# Patient Record
Sex: Female | Born: 1955 | Race: White | Hispanic: No | Marital: Married | Smoking: Never smoker
Health system: Southern US, Community
[De-identification: ages and names within clinical notes are randomized; demographics above are authoritative.]

## PROBLEM LIST (undated history)

## (undated) DIAGNOSIS — M1990 Unspecified osteoarthritis, unspecified site: Secondary | ICD-10-CM

## (undated) DIAGNOSIS — E7800 Pure hypercholesterolemia, unspecified: Secondary | ICD-10-CM

---

## 1998-06-28 ENCOUNTER — Other Ambulatory Visit: Admission: RE | Admit: 1998-06-28 | Discharge: 1998-06-28 | Payer: Self-pay | Admitting: Gynecology

## 1999-09-28 ENCOUNTER — Other Ambulatory Visit: Admission: RE | Admit: 1999-09-28 | Discharge: 1999-09-28 | Payer: Self-pay | Admitting: Gynecology

## 2000-10-18 ENCOUNTER — Encounter: Admission: RE | Admit: 2000-10-18 | Discharge: 2000-10-18 | Payer: Self-pay | Admitting: *Deleted

## 2000-10-18 ENCOUNTER — Encounter (INDEPENDENT_AMBULATORY_CARE_PROVIDER_SITE_OTHER): Payer: Self-pay | Admitting: Specialist

## 2000-10-18 ENCOUNTER — Other Ambulatory Visit: Admission: RE | Admit: 2000-10-18 | Discharge: 2000-10-18 | Payer: Self-pay | Admitting: *Deleted

## 2000-12-13 ENCOUNTER — Other Ambulatory Visit: Admission: RE | Admit: 2000-12-13 | Discharge: 2000-12-13 | Payer: Self-pay | Admitting: Gynecology

## 2003-02-15 ENCOUNTER — Other Ambulatory Visit: Admission: RE | Admit: 2003-02-15 | Discharge: 2003-02-15 | Payer: Self-pay | Admitting: Gynecology

## 2004-03-27 ENCOUNTER — Other Ambulatory Visit: Admission: RE | Admit: 2004-03-27 | Discharge: 2004-03-27 | Payer: Self-pay | Admitting: Gynecology

## 2005-03-19 ENCOUNTER — Other Ambulatory Visit: Admission: RE | Admit: 2005-03-19 | Discharge: 2005-03-19 | Payer: Self-pay | Admitting: Gynecology

## 2007-05-26 ENCOUNTER — Other Ambulatory Visit: Admission: RE | Admit: 2007-05-26 | Discharge: 2007-05-26 | Payer: Self-pay | Admitting: Gynecology

## 2014-10-17 ENCOUNTER — Emergency Department (HOSPITAL_BASED_OUTPATIENT_CLINIC_OR_DEPARTMENT_OTHER)
Admission: EM | Admit: 2014-10-17 | Discharge: 2014-10-17 | Disposition: A | Discharge status: Home / Self Care | Payer: Managed Care, Other (non HMO) | Attending: Emergency Medicine | Admitting: Emergency Medicine

## 2014-10-17 ENCOUNTER — Encounter (HOSPITAL_BASED_OUTPATIENT_CLINIC_OR_DEPARTMENT_OTHER): Payer: Self-pay | Admitting: *Deleted

## 2014-10-17 DIAGNOSIS — Y998 Other external cause status: Secondary | ICD-10-CM | POA: Insufficient documentation

## 2014-10-17 DIAGNOSIS — E780 Pure hypercholesterolemia: Secondary | ICD-10-CM | POA: Diagnosis not present

## 2014-10-17 DIAGNOSIS — S0181XA Laceration without foreign body of other part of head, initial encounter: Secondary | ICD-10-CM

## 2014-10-17 DIAGNOSIS — Z23 Encounter for immunization: Secondary | ICD-10-CM | POA: Diagnosis not present

## 2014-10-17 DIAGNOSIS — Y9389 Activity, other specified: Secondary | ICD-10-CM | POA: Diagnosis not present

## 2014-10-17 DIAGNOSIS — S0121XA Laceration without foreign body of nose, initial encounter: Secondary | ICD-10-CM | POA: Insufficient documentation

## 2014-10-17 DIAGNOSIS — Z8739 Personal history of other diseases of the musculoskeletal system and connective tissue: Secondary | ICD-10-CM | POA: Diagnosis not present

## 2014-10-17 DIAGNOSIS — W01198A Fall on same level from slipping, tripping and stumbling with subsequent striking against other object, initial encounter: Secondary | ICD-10-CM | POA: Insufficient documentation

## 2014-10-17 DIAGNOSIS — Z79899 Other long term (current) drug therapy: Secondary | ICD-10-CM | POA: Diagnosis not present

## 2014-10-17 DIAGNOSIS — Y9289 Other specified places as the place of occurrence of the external cause: Secondary | ICD-10-CM | POA: Insufficient documentation

## 2014-10-17 HISTORY — DX: Pure hypercholesterolemia, unspecified: E78.00

## 2014-10-17 HISTORY — DX: Unspecified osteoarthritis, unspecified site: M19.90

## 2014-10-17 MED ORDER — TETANUS-DIPHTH-ACELL PERTUSSIS 5-2.5-18.5 LF-MCG/0.5 IM SUSP
0.5000 mL | Freq: Once | INTRAMUSCULAR | Status: AC
  Administered 2014-10-17: 0.5 mL via INTRAMUSCULAR
  Filled 2014-10-17: qty 0.5

## 2014-10-17 NOTE — ED Provider Notes (Signed)
CSN: [id number]     Arrival date & time 10/17/14  1722 History   First MD Initiated Contact with Patient 10/17/14 1732     Chief Complaint  Patient presents with  . Facial Laceration     (Consider location/radiation/quality/duration/timing/severity/associated sxs/prior Treatment) Patient is a 58 y.o. female presenting with skin laceration. The history is provided by the patient.  Laceration Location:  Face Facial laceration location:  Nose  Lynn Cummings is a 58 y.o. female who presents to the ED with a laceration to the nasal bridge. She states that last night she tripped over her dog and fell against the refrigerator. She was wearing her glasses and when she hit her nose the metal on the glasses caused the laceration. She denies any other injuries. She denies LOC.   Past Medical History  Diagnosis Date  . Arthritis   . High cholesterol    History reviewed. No pertinent past surgical history. No family history on file. History  Substance Use Topics  . Smoking status: Never Smoker   . Smokeless tobacco: Not on file  . Alcohol Use: Yes     Comment: ocassiona   OB History    No data available     Review of Systems Negative except as stated in HPI   Allergies  Review of patient's allergies indicates no known allergies.  Home Medications   Prior to Admission medications   Medication Sig Start Date End Date Taking? Authorizing Provider  alendronate (FOSAMAX) 35 MG tablet Take 35 mg by mouth every 7 (seven) days. Take with a full glass of water on an empty stomach.   Yes Historical Provider, MD  simvastatin (ZOCOR) 10 MG tablet Take 10 mg by mouth daily.   Yes Historical Provider, MD   BP 154/86 mmHg  Pulse 94  Temp(Src) 98.3 F (36.8 C) (Oral)  Resp 16  Ht 5\' 4"  (1.626 m)  Wt 145 lb (65.772 kg)  BMI 24.88 kg/m2  SpO2 100% Physical Exam  Constitutional: She is oriented to person, place, and time. She appears well-developed and well-nourished.  HENT:  Right  Ear: Tympanic membrane normal.  Left Ear: Tympanic membrane normal.  Nose: Nose lacerations present.    Mouth/Throat: Uvula is midline, oropharynx is clear and moist and mucous membranes are normal. Normal dentition.  Eyes: Conjunctivae and EOM are normal. Pupils are equal, round, and reactive to light.  Neck: Normal range of motion. Neck supple.  Cardiovascular: Normal rate.   Pulmonary/Chest: Effort normal.  Musculoskeletal: Normal range of motion.  Neurological: She is alert and oriented to person, place, and time. No cranial nerve deficit.  Skin: Skin is warm and dry.  Psychiatric: She has a normal mood and affect. Her behavior is normal.  Nursing note and vitals reviewed.   ED Course  Procedures  LACERATION REPAIR Performed by: Cummings,Lynn Authorized by: Cummings,Lynn Consent: Verbal consent obtained. Risks and benefits: risks, benefits and alternatives were discussed Consent given by: patient Patient identity confirmed: provided demographic data Prepped and Draped in normal sterile fashion Wound explored  Laceration Location: nasal bridge  Laceration Length: 1.5 cm  No Foreign Bodies seen or palpated  Anesthesia: none  Cleaned with betadine and NSS  IAmount of cleaning: standard  Skin closure: dermabond  Patient tolerance: Patient tolerated the procedure well with no immediate complications.  Tetanus updated  MDM  58 y.o. female with laceration to the nasal bridge that occurred last night. Stable for discharge without signs of infection. Discussed with the patient and all  questioned fully answered. She will return if any problems arise.     [address]Hope MeyersdaleM Neese, NP 10/17/14 1753  Gilda Creasehristopher J. Pollina, MD 10/17/14 [phone]

## 2014-10-17 NOTE — ED Notes (Signed)
Patient tripped over her dog last night and now has a lac to the bridge of her nose

## 2016-05-22 ENCOUNTER — Emergency Department (HOSPITAL_BASED_OUTPATIENT_CLINIC_OR_DEPARTMENT_OTHER)
Admission: EM | Admit: 2016-05-22 | Discharge: 2016-05-22 | Disposition: A | Discharge status: Home / Self Care | Payer: BLUE CROSS/BLUE SHIELD | Attending: Emergency Medicine | Admitting: Emergency Medicine

## 2016-05-22 ENCOUNTER — Emergency Department (HOSPITAL_BASED_OUTPATIENT_CLINIC_OR_DEPARTMENT_OTHER): Payer: BLUE CROSS/BLUE SHIELD

## 2016-05-22 ENCOUNTER — Encounter (HOSPITAL_BASED_OUTPATIENT_CLINIC_OR_DEPARTMENT_OTHER): Payer: Self-pay

## 2016-05-22 DIAGNOSIS — S0121XA Laceration without foreign body of nose, initial encounter: Secondary | ICD-10-CM | POA: Diagnosis not present

## 2016-05-22 DIAGNOSIS — S025XXA Fracture of tooth (traumatic), initial encounter for closed fracture: Secondary | ICD-10-CM | POA: Insufficient documentation

## 2016-05-22 DIAGNOSIS — S0993XA Unspecified injury of face, initial encounter: Secondary | ICD-10-CM | POA: Diagnosis present

## 2016-05-22 DIAGNOSIS — Y939 Activity, unspecified: Secondary | ICD-10-CM | POA: Insufficient documentation

## 2016-05-22 DIAGNOSIS — W01198A Fall on same level from slipping, tripping and stumbling with subsequent striking against other object, initial encounter: Secondary | ICD-10-CM | POA: Insufficient documentation

## 2016-05-22 DIAGNOSIS — Y999 Unspecified external cause status: Secondary | ICD-10-CM | POA: Diagnosis not present

## 2016-05-22 DIAGNOSIS — M1990 Unspecified osteoarthritis, unspecified site: Secondary | ICD-10-CM | POA: Insufficient documentation

## 2016-05-22 DIAGNOSIS — W19XXXA Unspecified fall, initial encounter: Secondary | ICD-10-CM

## 2016-05-22 DIAGNOSIS — Y92009 Unspecified place in unspecified non-institutional (private) residence as the place of occurrence of the external cause: Secondary | ICD-10-CM | POA: Insufficient documentation

## 2016-05-22 DIAGNOSIS — S0083XA Contusion of other part of head, initial encounter: Secondary | ICD-10-CM

## 2016-05-22 DIAGNOSIS — S0081XA Abrasion of other part of head, initial encounter: Secondary | ICD-10-CM

## 2016-05-22 MED ORDER — LIDOCAINE HCL 1 % IJ SOLN
INTRAMUSCULAR | Status: AC
  Administered 2016-05-22: 20 mL
  Filled 2016-05-22: qty 20

## 2016-05-22 MED ORDER — LIDOCAINE-EPINEPHRINE 2 %-1:100000 IJ SOLN
20.0000 mL | Freq: Once | INTRAMUSCULAR | Status: DC
  Filled 2016-05-22: qty 1

## 2016-05-22 MED ORDER — TETANUS-DIPHTH-ACELL PERTUSSIS 5-2.5-18.5 LF-MCG/0.5 IM SUSP
0.5000 mL | Freq: Once | INTRAMUSCULAR | Status: AC
  Administered 2016-05-22: 0.5 mL via INTRAMUSCULAR
  Filled 2016-05-22: qty 0.5

## 2016-05-22 NOTE — ED Notes (Signed)
Pt states she slipped and fell in the kitchen and hit her face on the floor.  She denies LOC, is not very specific as to how she injured herself.  Pt has abrasion to nose with bruising, abrasion to chin and chipped front tooth.  Pt is slurring words and admits to drinking alcohol tonight.

## 2016-05-22 NOTE — ED Notes (Signed)
Patient transported to CT 

## 2016-05-22 NOTE — ED Notes (Signed)
Pt verbalizes understanding of d/c instructions and denies any further needs at this time. 

## 2016-05-22 NOTE — ED Notes (Signed)
Dr. Read DriversMolpus at bedside suturing

## 2016-05-22 NOTE — ED Notes (Signed)
Pt returned from CT °

## 2016-05-22 NOTE — ED Provider Notes (Addendum)
CSN: [id number]     Arrival date & time 05/22/16  0121 History   First MD Initiated Contact with Patient 05/22/16 0135     Chief Complaint  Patient presents with  . Facial Injury     (Consider location/radiation/quality/duration/timing/severity/associated sxs/prior Treatment) HPI  This is a 60 year old female who had a mechanical fall this morning just prior to arrival. She slipped in the kitchen and fell hitting her face. There was no loss of consciousness. She does admit to drinking alcohol earlier. She has a laceration and abrasion to her nose and an abrasion to her chin with  chipped upper central incisors. She denies neck pain or other injury. Bleeding is been controlled.  Past Medical History  Diagnosis Date  . Arthritis   . High cholesterol    History reviewed. No pertinent past surgical history. No family history on file. Social History  Substance Use Topics  . Smoking status: Never Smoker   . Smokeless tobacco: None  . Alcohol Use: Yes     Comment: ocassiona   OB History    No data available     Review of Systems  All other systems reviewed and are negative.   Allergies  Review of patient's allergies indicates no known allergies.  Home Medications   Prior to Admission medications   Medication Sig Start Date End Date Taking? Authorizing Provider  alendronate (FOSAMAX) 35 MG tablet Take 35 mg by mouth every 7 (seven) days. Take with a full glass of water on an empty stomach.    Historical Provider, MD  simvastatin (ZOCOR) 10 MG tablet Take 10 mg by mouth daily.    Historical Provider, MD   BP 97/79 mmHg  Pulse 63  Temp(Src) 97.6 F (36.4 C) (Oral)  Resp 18  Ht 5\' 4"  (1.626 m)  Wt 135 lb (61.236 kg)  BMI 23.16 kg/m2  SpO2 96%   Physical Exam  General: Well-developed, well-nourished female in no acute distress; appearance consistent with age of record HENT: normocephalic; abrasions and flap laceration to nose, dried blood in nares, abrasion to upper lip  and chin, swelling to left cheek, Ellis I fracture upper central incisors:    Eyes: pupils equal, round and reactive to light; extraocular muscles intact Neck: supple; no C-spine tenderness Heart: regular rate and rhythm Lungs: clear to auscultation bilaterally Abdomen: soft; nondistended; nontender; bowel sounds present Extremities: No deformity; full range of motion; pulses normal Neurologic: Awake, alert and oriented; motor function intact in all extremities and symmetric; no facial droop Skin: Warm and dry Psychiatric: Normal mood and affect    ED Course  Procedures (including critical care time)  LACERATION REPAIR Performed by: Emaya Preston L Authorized by: Hanley SeamenMOLPUS,Arzu Mcgaughey L Consent: Verbal consent obtained. Risks and benefits: risks, benefits and alternatives were discussed Consent given by: patient Patient identity confirmed: provided demographic data Prepped and Draped in normal sterile fashion Wound explored  Laceration Location: Left nose  Laceration Length: 2.5 cm (flap)  No Foreign Bodies seen or palpated  Anesthesia: local infiltration  Local anesthetic: lidocaine 2 % without epinephrine  Anesthetic total: One ml  Irrigation method: syringe Amount of cleaning: standard  Skin closure: 5-0 Prolene   Number of sutures: 4   Technique: Simple interrupted   Patient tolerance: Patient tolerated the procedure well with no immediate complications.    Patient was advised that the flap may be devitalized and thus may not "take". She was advised to have the sutures removed in 5 days.  MDM  Nursing notes and  vitals signs, including pulse oximetry, reviewed.  Summary of this visit's results, reviewed by myself:  Imaging Studies: Ct Maxillofacial Wo Cm  06/19/2016  CLINICAL DATA:  Slipped and fell in kitchen. Hit face on floor. Abrasion at the nose and chin. Chipped front tooth. Initial encounter. EXAM: CT MAXILLOFACIAL WITHOUT CONTRAST TECHNIQUE: Multidetector  CT imaging of the maxillofacial structures was performed. Multiplanar CT image reconstructions were also generated. A small metallic BB was placed on the right temple in order to reliably differentiate right from left. COMPARISON:  None. FINDINGS: There is no evidence of fracture or dislocation. The maxilla and mandible appear intact. The nasal bone is unremarkable in appearance. The visualized dentition demonstrates no acute abnormality. The known dental abnormality is not well seen due to metal artifact. The orbits are intact bilaterally. Minimal mucosal thickening is noted at the left maxillary sinus. The remaining visualized paranasal sinuses and mastoid air cells are well-aerated. Mild soft tissue swelling is noted at the chin. The parapharyngeal fat planes are preserved. The nasopharynx, oropharynx and hypopharynx are unremarkable in appearance. The visualized portions of the valleculae and piriform sinuses are grossly unremarkable. The parotid and submandibular glands are within normal limits. No cervical lymphadenopathy is seen. Mild cortical volume loss is noted. Mild cerebellar atrophy is seen. IMPRESSION: 1. No evidence of fracture of the maxillofacial structures. 2. Mild soft tissue swelling at the chin. 3. Minimal mucosal thickening at the left maxillary sinus. 4. Mild cortical volume loss noted Electronically Signed   By: Roanna Raider M.D.   On: June 19, 2016 03:12   The patient has a dentist with whom she will follow up regarding her fractured incisors.  Fall at home, initial encounter  Facial contusion, initial encounter  Laceration of nose with complication, initial encounter  Abrasion of face, initial encounter  Traumatic fracture of tooth, closed, initial encounter    Paula Libra, MD 19-Jun-2016 1610  Paula Libra, MD 2016/06/19 [id number]

## 2020-07-23 ENCOUNTER — Emergency Department (HOSPITAL_BASED_OUTPATIENT_CLINIC_OR_DEPARTMENT_OTHER)
Admission: EM | Admit: 2020-07-23 | Discharge: 2020-07-23 | Disposition: A | Discharge status: Home / Self Care | Payer: 59 | Attending: Emergency Medicine | Admitting: Emergency Medicine

## 2020-07-23 ENCOUNTER — Encounter (HOSPITAL_BASED_OUTPATIENT_CLINIC_OR_DEPARTMENT_OTHER): Payer: Self-pay | Admitting: Emergency Medicine

## 2020-07-23 ENCOUNTER — Other Ambulatory Visit: Payer: Self-pay

## 2020-07-23 ENCOUNTER — Emergency Department (HOSPITAL_BASED_OUTPATIENT_CLINIC_OR_DEPARTMENT_OTHER): Payer: 59

## 2020-07-23 DIAGNOSIS — S0083XA Contusion of other part of head, initial encounter: Secondary | ICD-10-CM | POA: Diagnosis not present

## 2020-07-23 DIAGNOSIS — Y9289 Other specified places as the place of occurrence of the external cause: Secondary | ICD-10-CM | POA: Diagnosis not present

## 2020-07-23 DIAGNOSIS — W19XXXA Unspecified fall, initial encounter: Secondary | ICD-10-CM

## 2020-07-23 DIAGNOSIS — W010XXA Fall on same level from slipping, tripping and stumbling without subsequent striking against object, initial encounter: Secondary | ICD-10-CM | POA: Diagnosis not present

## 2020-07-23 DIAGNOSIS — L03213 Periorbital cellulitis: Secondary | ICD-10-CM | POA: Diagnosis not present

## 2020-07-23 DIAGNOSIS — Y998 Other external cause status: Secondary | ICD-10-CM | POA: Insufficient documentation

## 2020-07-23 DIAGNOSIS — G501 Atypical facial pain: Secondary | ICD-10-CM | POA: Diagnosis present

## 2020-07-23 DIAGNOSIS — R220 Localized swelling, mass and lump, head: Secondary | ICD-10-CM | POA: Insufficient documentation

## 2020-07-23 DIAGNOSIS — Y9301 Activity, walking, marching and hiking: Secondary | ICD-10-CM | POA: Insufficient documentation

## 2020-07-23 MED ORDER — CLINDAMYCIN HCL 300 MG PO CAPS
300.0000 mg | ORAL_CAPSULE | Freq: Three times a day (TID) | ORAL | 0 refills | Status: AC
Start: 2020-07-23 — End: 2020-07-30

## 2020-07-23 MED ORDER — FLUORESCEIN SODIUM 1 MG OP STRP
1.0000 | ORAL_STRIP | Freq: Once | OPHTHALMIC | Status: AC
  Administered 2020-07-23: 1 via OPHTHALMIC
  Filled 2020-07-23: qty 1

## 2020-07-23 MED ORDER — TETRACAINE HCL 0.5 % OP SOLN
1.0000 [drp] | Freq: Once | OPHTHALMIC | Status: AC
  Administered 2020-07-23: 1 [drp] via OPHTHALMIC
  Filled 2020-07-23: qty 4

## 2020-07-23 NOTE — ED Provider Notes (Signed)
[facility] EMERGENCY DEPARTMENT Provider Note   CSN: [id number] Arrival date & time: 07/23/20  0941     History Chief Complaint  Patient presents with  . Fall    Lynn Cummings is a 64 y.o. female presents for evaluation of left-sided facial pain, swelling that began after mechanical fall approximately 3 days ago.  She reports that she was walking her dog and states that he pulled on the leash, causing her to fall and land on the cement.  She reports hitting her face.  No LOC.  She states since then, she has had pain, swelling noted around the left eye and left face.  She states that she was wearing contacts at the time.  She was able to take them out and states that she has not worn them since.  She states that she has not had any vision changes.  She states that the pain and swelling have increased, prompting ED visit today.  She is not on blood thinners.  She denies any neck pain, numbness/weakness of arms or legs.  The history is provided by the patient.       Past Medical History:  Diagnosis Date  . Arthritis   . High cholesterol     There are no problems to display for this patient.   History reviewed. No pertinent surgical history.   OB History   No obstetric history on file.     No family history on file.  Social History   Tobacco Use  . Smoking status: Never Smoker  . Smokeless tobacco: Never Used  Substance Use Topics  . Alcohol use: Yes    Comment: ocassiona  . Drug use: Not on file    Home Medications Prior to Admission medications   Medication Sig Start Date End Date Taking? Authorizing Provider  alendronate (FOSAMAX) 35 MG tablet Take 35 mg by mouth every 7 (seven) days. Take with a full glass of water on an empty stomach.    [provider]  clindamycin (CLEOCIN) 300 MG capsule Take 1 capsule (300 mg total) by mouth 3 (three) times daily for 7 days. 07/23/20 07/30/20  Maxwell Caul, PA-C  simvastatin (ZOCOR) 10 MG tablet  Take 10 mg by mouth daily.    [provider]    Allergies    Patient has no known allergies.  Review of Systems   Review of Systems  HENT: Positive for facial swelling.   Eyes: Negative for visual disturbance.  Gastrointestinal: Negative for vomiting.  Skin: Positive for wound.  Neurological: Negative for weakness and numbness.  All other systems reviewed and are negative.   Physical Exam Updated Vital Signs BP (!) 151/69 (BP Location: Right Arm)   Pulse 73   Temp 98.4 F (36.9 C) (Oral)   Resp 18   Ht 5\' 4"  (1.626 m)   Wt 59 kg   SpO2 99%   BMI 22.31 kg/m   Physical Exam Vitals and nursing note reviewed.  Constitutional:      Appearance: She is well-developed.  HENT:     Head: Normocephalic and atraumatic.      Comments: Tenderness palpation, swelling, erythema, ecchymosis noted around the superior inferior left periorbital region.  No tenderness palpation of the nasal bridge, zygomatic arch, mandible or maxilla. Eyes:     General: No scleral icterus.       Right eye: No discharge.        Left eye: No discharge.     Conjunctiva/sclera:  Left eye: Left conjunctiva is injected.     Comments: PERRL. EOMs intact without pain. No nystagmus. No neglect. Visual fields intact.  No hyphema, hypopyon.  Mild conjunctival injection noted to the lateral aspect.  Pulmonary:     Effort: Pulmonary effort is normal.  Skin:    General: Skin is warm and dry.     Comments: 3 x 2 cm abrasion with some purulent drainage noted to the left face.  Neurological:     Mental Status: She is alert.     Comments: Cranial nerves III-XII intact Follows commands, Moves all extremities  5/5 strength to BUE and BLE  Sensation intact throughout all major nerve distributions. No gait abnormalities  No slurred speech. No facial droop.   Psychiatric:        Speech: Speech normal.        Behavior: Behavior normal.          ED Results / Procedures / Treatments   Labs (all  labs ordered are listed, but only abnormal results are displayed) Labs Reviewed - No data to display  EKG None  Radiology CT Orbits Wo Contrast  Result Date: 07/23/2020 CLINICAL DATA:  Facial trauma status post fall. Left eye swelling and redness. EXAM: CT ORBITS WITHOUT CONTRAST TECHNIQUE: Multidetector CT images were obtained using the standard protocol without intravenous contrast. COMPARISON:  None. FINDINGS: Orbits: No traumatic or inflammatory finding. Globes, optic nerves, orbital fat, extraocular muscles, vascular structures, and lacrimal glands are normal. Visualized sinuses: Paranasal sinuses are clear. Mastoid sinuses are clear. Leftward deviation of the nasal septum. Soft tissues: Severe left periorbital soft tissue swelling. Mild soft tissue swelling over the left maxillary sinus. Limited intracranial: No significant or unexpected finding. IMPRESSION: 1. No acute osseous injury of the left orbit. 2. Severe left periorbital soft tissue swelling. Mild soft tissue swelling over the left maxillary sinus. Electronically Signed   By: Elige Ko   On: 07/23/2020 13:11    Procedures Procedures (including critical care time)  Medications Ordered in ED Medications  tetracaine (PONTOCAINE) 0.5 % ophthalmic solution 1 drop (1 drop Both Eyes Given by Other 07/23/20 1447)  fluorescein ophthalmic strip 1 strip (1 strip Both Eyes Given by Other 07/23/20 1448)    ED Course  I have reviewed the triage vital signs and the nursing notes.  Pertinent labs & imaging results that were available during my care of the patient were reviewed by me and considered in my medical decision making (see chart for details).    MDM Rules/Calculators/A&P                          64 year old female who presents for evaluation of facial pain, swelling after mechanical fall that occurred approximately 3 days ago.  No LOC.  She is not on blood thinner.  No blurry vision, vision changes.  Initially arrival, she is  afebrile, nontoxic-appearing.  Vital signs are stable.  She has swelling, tenderness noted around the left periorbital region.  EOMs intact without pain.  No hyphema, hypopyon.  Concern for fracture.  We will plan for imaging of the orbits.  CT scan negative for any acute bony abnormality.  No evidence of fractures.  Woods lamp evaluation shows no evidence of fluorescein uptake, corneal abrasion.  Intraocular pressure as documented below:  Left IOP: 16 Right IOP: 14    Visual Acuity  Right Eye Distance: 20/20 Left Eye Distance: 20/20 Bilateral Distance: 20/20  Right Eye Near:  R Near: 20/20 Left Eye Near:  L Near: 20/20 Bilateral Near:  20/20 (with corrective lenses)  Patient with reassuring visual acuity, IOP's.  She does have some purulent drainage noted to the abrasion.  Question if some of the erythema that she has around her is beginning infection.  She has no evidence of pain with EOMs.  Do not suspect orbital cellulitis.  We will plan to send her home on clindamycin. At this time, patient exhibits no emergent life-threatening condition that require further evaluation in ED or admission. Patient had ample opportunity for questions and discussion. All patient's questions were answered with full understanding. Strict return precautions discussed. Patient expresses understanding and agreement to plan.   Portions of this note were generated with Scientist, clinical (histocompatibility and immunogenetics). Dictation errors may occur despite best attempts at proofreading.   Final Clinical Impression(s) / ED Diagnoses Final diagnoses:  Fall, initial encounter  Contusion of face, initial encounter  Periorbital cellulitis of left eye    Rx / DC Orders ED Discharge Orders         Ordered    clindamycin (CLEOCIN) 300 MG capsule  3 times daily     Discontinue  Reprint     07/23/20 1502           Maxwell Caul, PA-C 07/23/20 1619    Arby Barrette, MD 08/10/20 1429

## 2020-07-23 NOTE — ED Triage Notes (Addendum)
She was pulled down by her dog on Thursday landing face first on cement. Denies LOC. Swelling noted around L eye and abrasions to face. No blood thinners

## 2020-07-23 NOTE — Discharge Instructions (Signed)
Your CT scan did not show any evidence of broken bones.  As we discussed, apply ice to help with the swelling.  Take antibiotics to prevent any infection.  Follow-up with your eye doctor.  Return to the emergency department for any fever, worsening pain, vision changes or any other worsening concerning symptoms.

## 2021-03-06 IMAGING — CT CT ORBITS W/O CM
3 series · 14 of 47 positions shown, 16 images · non-contrast
Comparison: None.

CLINICAL DATA: Facial trauma status post fall. Left eye swelling
and redness.

EXAM:
CT ORBITS WITHOUT CONTRAST
TECHNIQUE: Multidetector CT images were obtained using the standard protocol
without intravenous contrast.

[Series 3: orbits 2.0 h30s st · axial · 0.35mm/px · z∈[-150,-80]mm · 8 of 41 slices shown, 10 images]
[im 3/41  brain]
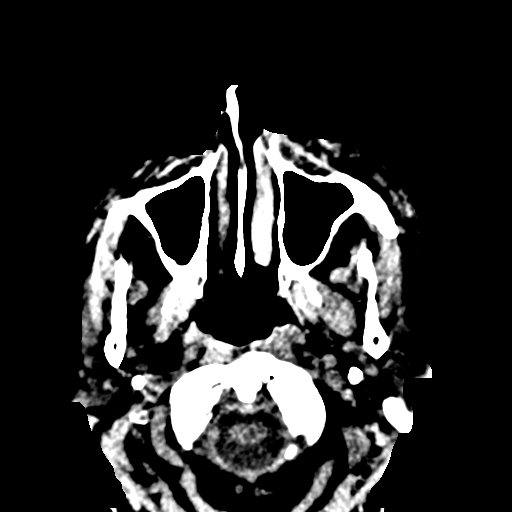
[im 3/41  bone]
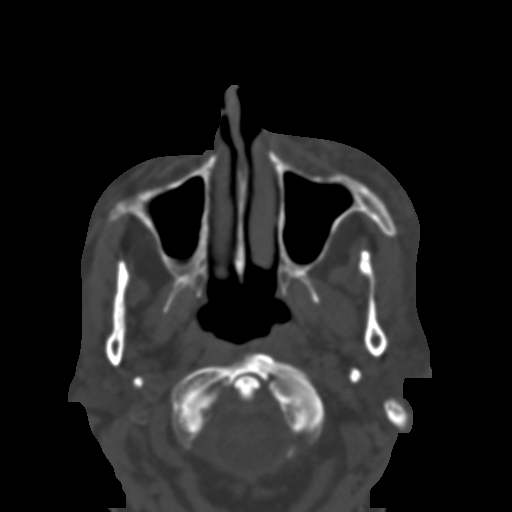
[im 9/41  bone]
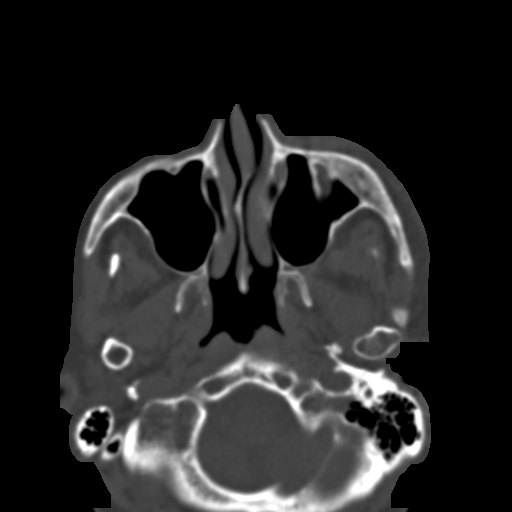
[im 13/41  bone]
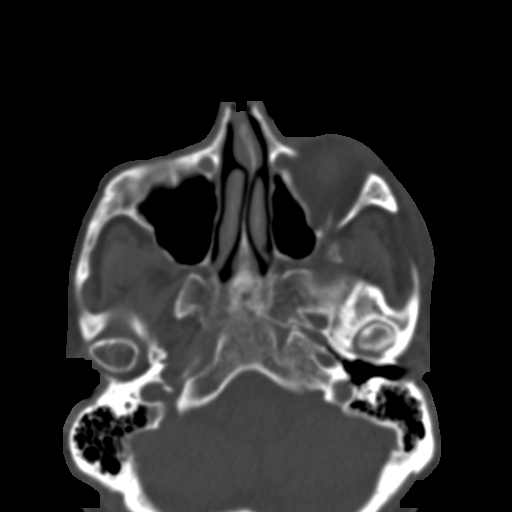
[im 18/41  bone]
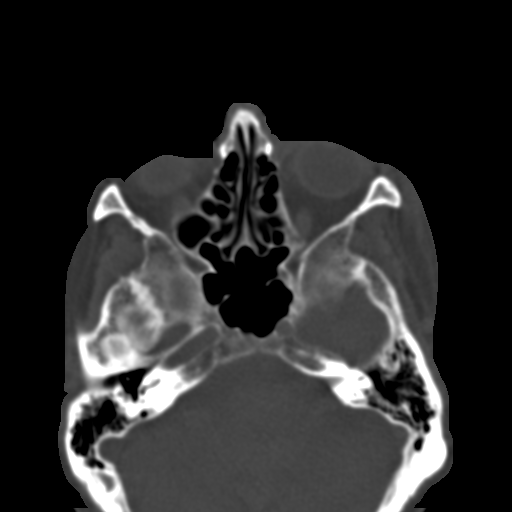
[im 23/41  brain]
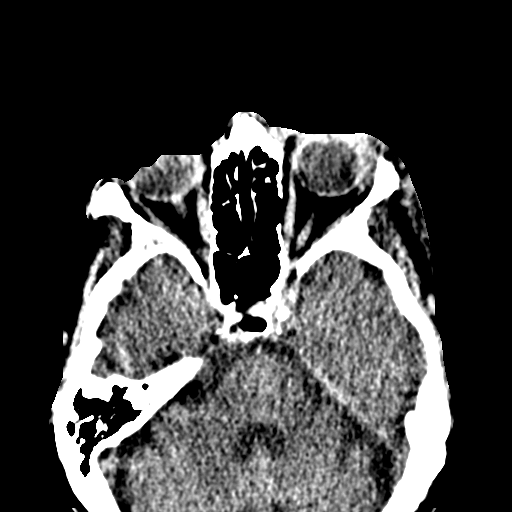
[im 23/41  bone]
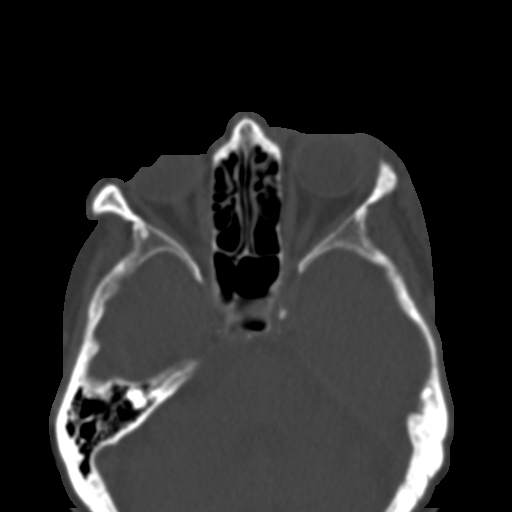
[im 28/41  bone]
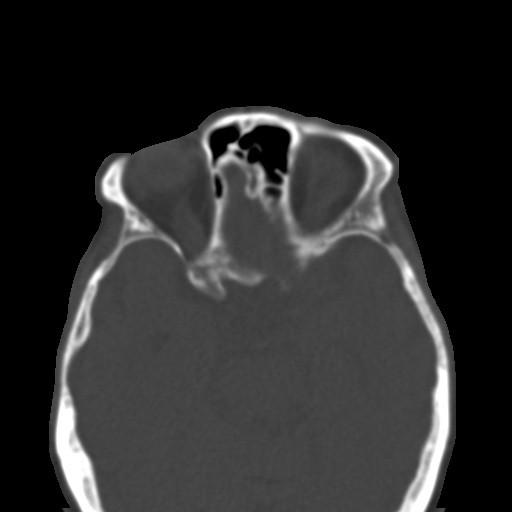
[im 32/41  bone]
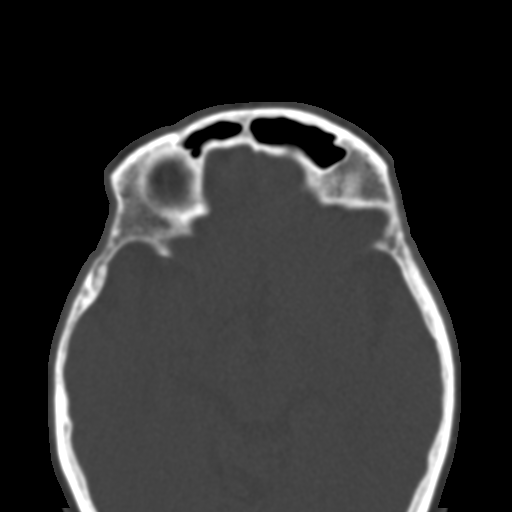
[im 38/41  bone]
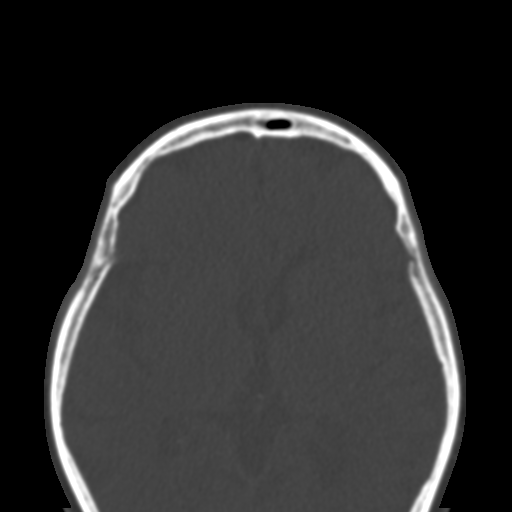

[Series 7: orbits 2.0 coronal · coronal · 0.18mm/px · 3 of 57 slices shown]
[im 19/57  bone]
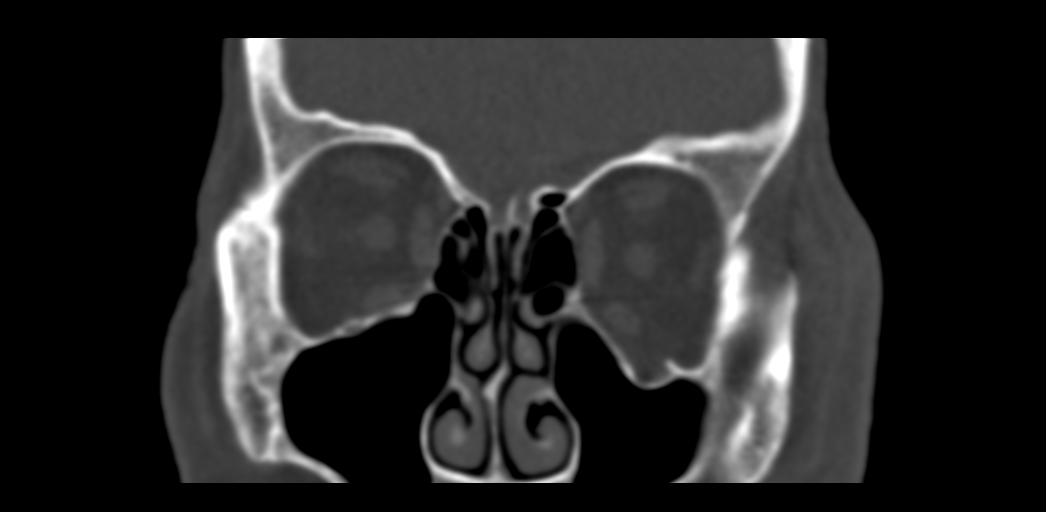
[im 25/57  bone]
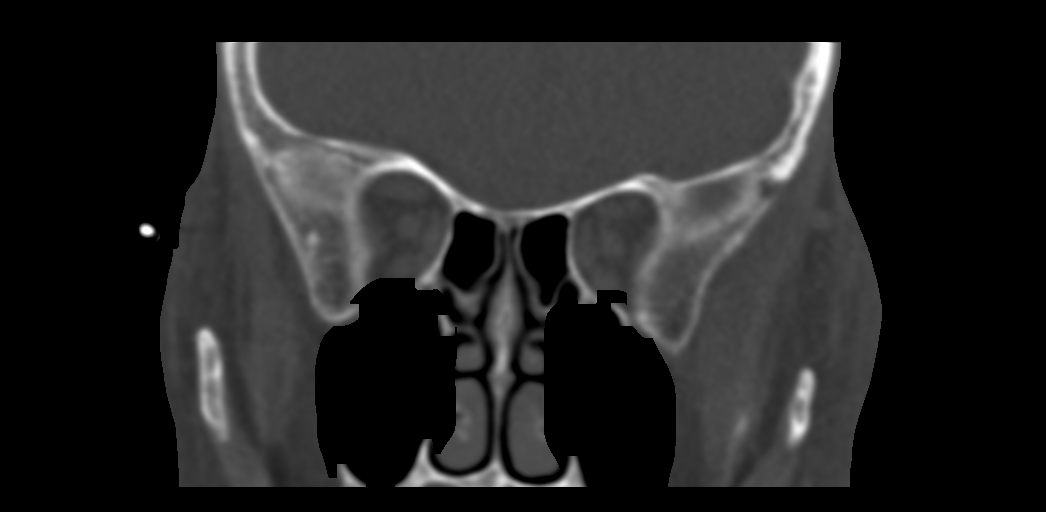
[im 32/57  bone]
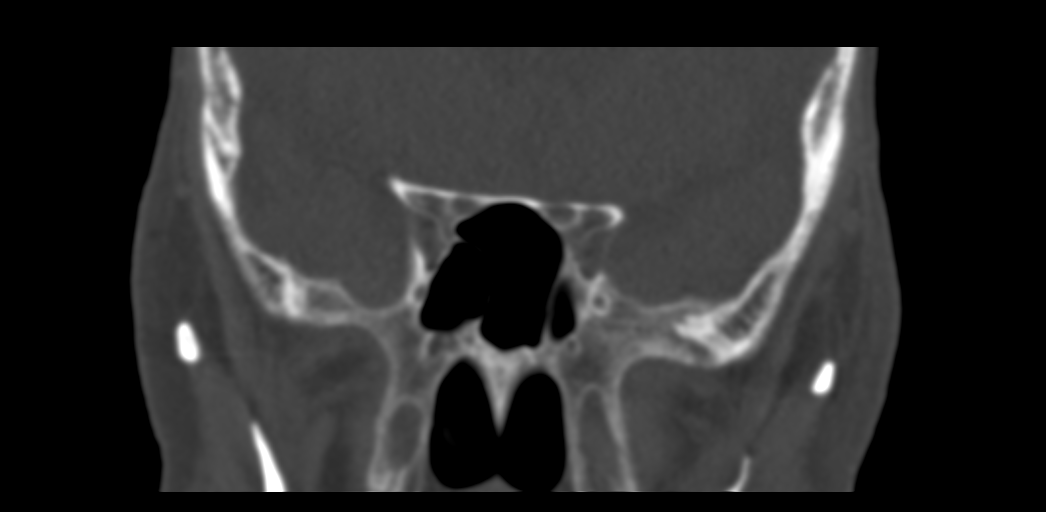

[Series 9: orbits 2.0 sagittal · sagittal · 0.18mm/px · 3 of 90 slices shown]
[im 30/90  bone]
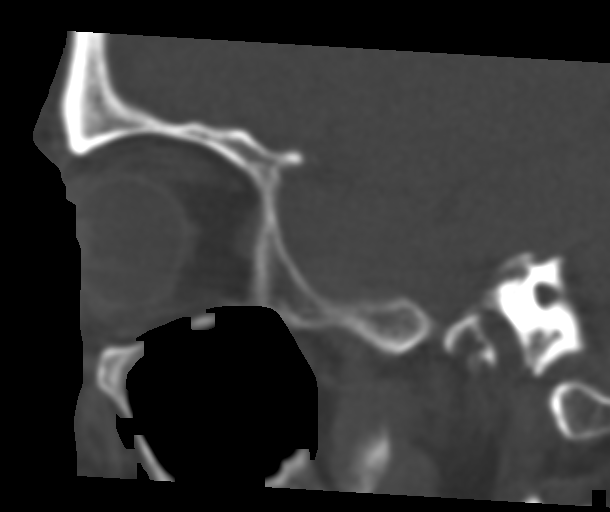
[im 45/90  bone]
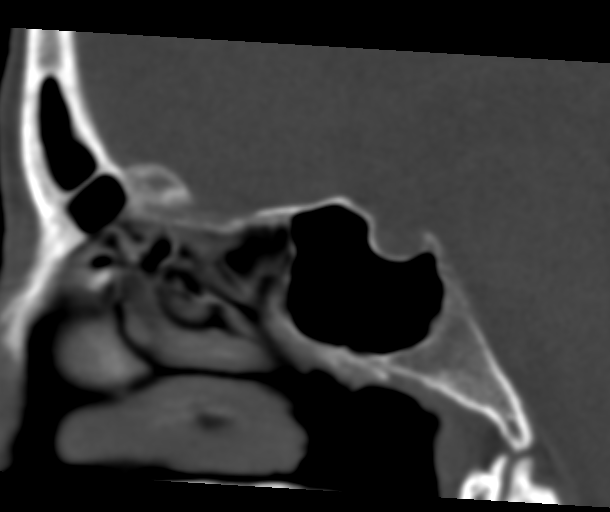
[im 60/90  bone]
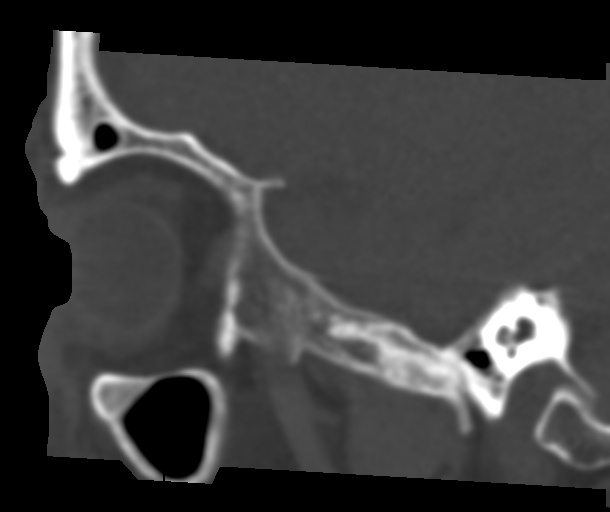

[14 of 47 positions shown; findings below may reference images not displayed]

FINDINGS: Orbits: No traumatic or inflammatory finding. Globes, optic nerves,
orbital fat, extraocular muscles, vascular structures, and lacrimal
glands are normal.

Visualized sinuses: Paranasal sinuses are clear. Mastoid sinuses are
clear. Leftward deviation of the nasal septum.

Soft tissues: Severe left periorbital soft tissue swelling. Mild
soft tissue swelling over the left maxillary sinus.

Limited intracranial: No significant or unexpected finding.
IMPRESSION: 1. No acute osseous injury of the left orbit.
2. Severe left periorbital soft tissue swelling. Mild soft tissue
swelling over the left maxillary sinus.

## 2023-07-25 ENCOUNTER — Other Ambulatory Visit: Payer: Self-pay

## 2023-07-25 ENCOUNTER — Emergency Department (HOSPITAL_BASED_OUTPATIENT_CLINIC_OR_DEPARTMENT_OTHER): Payer: Medicare Other

## 2023-07-25 ENCOUNTER — Encounter (HOSPITAL_BASED_OUTPATIENT_CLINIC_OR_DEPARTMENT_OTHER): Payer: Self-pay | Admitting: Emergency Medicine

## 2023-07-25 ENCOUNTER — Emergency Department (HOSPITAL_BASED_OUTPATIENT_CLINIC_OR_DEPARTMENT_OTHER)
Admission: EM | Admit: 2023-07-25 | Discharge: 2023-07-26 | Disposition: A | Discharge status: Home / Self Care | Payer: Medicare Other | Source: Home / Self Care | Attending: Emergency Medicine | Admitting: Emergency Medicine

## 2023-07-25 DIAGNOSIS — R42 Dizziness and giddiness: Secondary | ICD-10-CM

## 2023-07-25 DIAGNOSIS — R739 Hyperglycemia, unspecified: Secondary | ICD-10-CM

## 2023-07-25 DIAGNOSIS — R7309 Other abnormal glucose: Secondary | ICD-10-CM

## 2023-07-25 DIAGNOSIS — R7401 Elevation of levels of liver transaminase levels: Secondary | ICD-10-CM

## 2023-07-25 DIAGNOSIS — E876 Hypokalemia: Secondary | ICD-10-CM

## 2023-07-25 LAB — CBC WITH DIFFERENTIAL/PLATELET
Abs Immature Granulocytes: 0.01 10*3/uL (ref 0.00–0.07)
Basophils Absolute: 0 10*3/uL (ref 0.0–0.1)
Basophils Relative: 1 %
Eosinophils Absolute: 0 10*3/uL (ref 0.0–0.5)
Eosinophils Relative: 1 %
HCT: 38.9 % (ref 36.0–46.0)
Hemoglobin: 12.9 g/dL (ref 12.0–15.0)
Immature Granulocytes: 0 %
Lymphocytes Relative: 32 %
Lymphs Abs: 1.4 10*3/uL (ref 0.7–4.0)
MCH: 32.5 pg (ref 26.0–34.0)
MCHC: 33.2 g/dL (ref 30.0–36.0)
MCV: 98 fL (ref 80.0–100.0)
Monocytes Absolute: 0.4 10*3/uL (ref 0.1–1.0)
Monocytes Relative: 9 %
Neutro Abs: 2.6 10*3/uL (ref 1.7–7.7)
Neutrophils Relative %: 57 %
Platelets: 152 10*3/uL (ref 150–400)
RBC: 3.97 MIL/uL (ref 3.87–5.11)
RDW: 13.6 % (ref 11.5–15.5)
WBC: 4.4 10*3/uL (ref 4.0–10.5)
nRBC: 0 % (ref 0.0–0.2)

## 2023-07-25 LAB — COMPREHENSIVE METABOLIC PANEL
ALT: 33 U/L (ref 0–44)
AST: 65 U/L — ABNORMAL HIGH (ref 15–41)
Albumin: 4.2 g/dL (ref 3.5–5.0)
Alkaline Phosphatase: 107 U/L (ref 38–126)
Anion gap: 13 (ref 5–15)
BUN: 13 mg/dL (ref 8–23)
CO2: 27 mmol/L (ref 22–32)
Calcium: 9.4 mg/dL (ref 8.9–10.3)
Chloride: 102 mmol/L (ref 98–111)
Creatinine, Ser: 0.66 mg/dL (ref 0.44–1.00)
GFR, Estimated: >60 mL/min (ref >60)
Glucose, Bld: 153 mg/dL — ABNORMAL HIGH (ref 70–99)
Potassium: 3.3 mmol/L — ABNORMAL LOW (ref 3.5–5.1)
Sodium: 142 mmol/L (ref 135–145)
Total Bilirubin: 0.6 mg/dL (ref 0.3–1.2)
Total Protein: 6.7 g/dL (ref 6.5–8.1)

## 2023-07-25 LAB — CBG MONITORING, ED: Glucose-Capillary: 139 mg/dL — ABNORMAL HIGH (ref 70–99)

## 2023-07-25 NOTE — ED Provider Notes (Signed)
[facility] Emergency Department Provider Note MRN:  [id number]  Arrival date & time: 07/25/23     Chief Complaint   Dizziness   History of Present Illness   Lynn Cummings is a 67 y.o. year-old female with a history of hyperlipidemia presenting to the ED with chief complaint of dizziness.  Woke up with dizziness described as a room spinning sensation.  Fairly constant and lasting all day.  Does not seem changed or triggered by head position.  Having a lot of trouble ambulating due to the symptoms, needing a lot of assistance which is abnormal for her.  Review of Systems  A thorough review of systems was obtained and all systems are negative except as noted in the HPI and PMH.   Patient's Health History    Past Medical History:  Diagnosis Date   Arthritis    High cholesterol     History reviewed. No pertinent surgical history.  History reviewed. No pertinent family history.  Social History   Socioeconomic History   Marital status: Married    Spouse name: Not on file   Number of children: Not on file   Years of education: Not on file   Highest education level: Not on file  Occupational History   Not on file  Tobacco Use   Smoking status: Never   Smokeless tobacco: Never  Substance and Sexual Activity   Alcohol use: Yes    Comment: ocassiona   Drug use: Not on file   Sexual activity: Not on file  Other Topics Concern   Not on file  Social History Narrative   Not on file   Social Determinants of Health   Financial Resource Strain: Not on file  Food Insecurity: Not on file  Transportation Needs: Not on file  Physical Activity: Not on file  Stress: Not on file  Social Connections: Not on file  Intimate Partner Violence: Not on file     Physical Exam   Vitals:   07/25/23 1757  BP: (!) 152/86  Pulse: 94  Resp: 20  Temp: 98.2 F (36.8 C)  SpO2: 98%    CONSTITUTIONAL: Well-appearing, NAD NEURO/PSYCH:  Alert and  oriented x 3, normal and symmetric strength and sensation, possibly some subtle overshooting or hesitancy with finger-nose-finger testing on the right, mild action tremor to the left hand EYES:  eyes equal and reactive ENT/NECK:  no LAD, no JVD CARDIO: Regular rate, well-perfused, normal S1 and S2 PULM:  CTAB no wheezing or rhonchi GI/GU:  non-distended, non-tender MSK/SPINE:  No gross deformities, no edema SKIN:  no rash, atraumatic   *Additional and/or pertinent findings included in MDM below  Diagnostic and Interventional Summary    EKG Interpretation Date/Time:  Thursday July 25 2023 18:04:31 EDT Ventricular Rate:  86 PR Interval:    QRS Duration:  72 QT Interval:  340 QTC Calculation: 406 R Axis:   34  Text Interpretation: Interpretation limited secondary to artifact Normal sinus rhythm Nonspecific T wave abnormality Abnormal ECG No previous ECGs available Confirmed by Vanetta Mulders [phone]) on 07/25/2023 6:08:10 PM       Labs Reviewed  COMPREHENSIVE METABOLIC PANEL - Abnormal; Notable for the following components:      Result Value   Potassium 3.3 (*)    Glucose, Bld 153 (*)    AST 65 (*)    All other components within normal limits  CBG MONITORING, ED - Abnormal; Notable for the following components:   Glucose-Capillary 139 (*)  All other components within normal limits  CBC WITH DIFFERENTIAL/PLATELET  URINALYSIS, ROUTINE W REFLEX MICROSCOPIC    CT Head Wo Contrast  Final Result    MR BRAIN WO CONTRAST    (Results Pending)    Medications - No data to display   Procedures  /  Critical Care Procedures  ED Course and Medical Decision Making  Initial Impression and Ddx Persistent vertigo, nonpositional, fairly constant.  Overall a bit concerning for central cause given the description.  Given the fairly acute onset less than 24 hours ago, plan is for MRI imaging.  Past medical/surgical history that increases complexity of ED encounter: Hyperlipidemia,  otherwise minimal cardiovascular risk factors  Interpretation of Diagnostics I personally reviewed the EKG and my interpretation is as follows: Sinus rhythm  Labs overall reassuring with no significant blood count or electrolyte disturbance  Patient Reassessment and Ultimate Disposition/Management     Plan is for transfer to [facility] emergency department for MRI.  Dr. Preston Fleeting is the accepting ED provider.  Patient management required discussion with the following services or consulting groups:  None  Complexity of Problems Addressed Acute illness or injury that poses threat of life of bodily function  Additional Data Reviewed and Analyzed Further history obtained from: Further history from spouse/family member  Additional Factors Impacting ED Encounter Risk None  Elmer Sow. Pilar Plate, MD [facility] Emergency Medicine [facility] [email or web address]  Final Clinical Impressions(s) / ED Diagnoses     ICD-10-CM   1. Vertigo  R42       ED Discharge Orders     None        Discharge Instructions Discussed with and Provided to Patient:    Discharge Instructions      We are transferring you to the [facility] emergency department for MRI imaging tonight to evaluate for stroke.  You have elected to travel via private vehicle.      Sabas Sous, MD 07/25/23 [phone]

## 2023-07-25 NOTE — Discharge Instructions (Addendum)
Your MRI did not show any sign of a stroke.  This appears to be vertigo.  I have prescribed meclizine which she can take as often as 3 times a day as needed for vertigo.  For further evaluation, please follow-up with the neurologist.  Return if you have any new or concerning symptoms.

## 2023-07-25 NOTE — ED Triage Notes (Signed)
Pt reports dizziness (room spinning) since she woke this morning at 0830; denies numbness or weakness; no hx of vertigo; denies pain

## 2023-07-26 ENCOUNTER — Emergency Department (HOSPITAL_COMMUNITY): Payer: Medicare Other

## 2023-07-26 MED ORDER — MECLIZINE HCL 25 MG PO TABS: 25.0000 mg | ORAL_TABLET | Freq: Three times a day (TID) | ORAL | 0 refills | Status: DC | PRN

## 2023-07-26 MED ORDER — POTASSIUM CHLORIDE CRYS ER 20 MEQ PO TBCR: 20.0000 meq | EXTENDED_RELEASE_TABLET | Freq: Two times a day (BID) | ORAL | 0 refills | Status: AC

## 2023-07-26 MED ORDER — POTASSIUM CHLORIDE CRYS ER 20 MEQ PO TBCR
40.0000 meq | EXTENDED_RELEASE_TABLET | Freq: Once | ORAL | Status: AC
  Administered 2023-07-26: 40 meq via ORAL
  Filled 2023-07-26: qty 2

## 2023-07-26 NOTE — ED Notes (Signed)
Pt ambulated without dizzyness.

## 2023-07-26 NOTE — ED Notes (Signed)
Pt and husband, Kendell Bane, instructed to go straight to [facility] ED. Directions and paperwork provided.

## 2023-07-26 NOTE — ED Provider Notes (Signed)
Patient transferred from [facility] with dizziness, here to have MRI of the brain.  Patient states that she is still having dizziness, but is resting comfortably.  She reports no falls and does not feel like she is falling to 1 side.  MRI is pending.  MRI shows no evidence of acute stroke.  I have independently viewed the images, and agree with radiologist's interpretation.  I had the patient ambulate, and she was able to ambulate without difficulty.  I note that she was hypokalemic and I have ordered a dose of oral potassium.  I am discharging her with prescriptions for oral potassium and for meclizine.  I am referring her back to her primary care provider but also to neurology.  Return precautions discussed.  Results for orders placed or performed during the hospital encounter of 07/25/23  CBC with Differential  Result Value Ref Range   WBC 4.4 4.0 - 10.5 K/uL   RBC 3.97 3.87 - 5.11 MIL/uL   Hemoglobin 12.9 12.0 - 15.0 g/dL   HCT 16.1 09.6 - 04.5 %   MCV 98.0 80.0 - 100.0 fL   MCH 32.5 26.0 - 34.0 pg   MCHC 33.2 30.0 - 36.0 g/dL   RDW 40.9 81.1 - 91.4 %   Platelets 152 150 - 400 K/uL   nRBC 0.0 0.0 - 0.2 %   Neutrophils Relative % 57 %   Neutro Abs 2.6 1.7 - 7.7 K/uL   Lymphocytes Relative 32 %   Lymphs Abs 1.4 0.7 - 4.0 K/uL   Monocytes Relative 9 %   Monocytes Absolute 0.4 0.1 - 1.0 K/uL   Eosinophils Relative 1 %   Eosinophils Absolute 0.0 0.0 - 0.5 K/uL   Basophils Relative 1 %   Basophils Absolute 0.0 0.0 - 0.1 K/uL   Immature Granulocytes 0 %   Abs Immature Granulocytes 0.01 0.00 - 0.07 K/uL  Comprehensive metabolic panel  Result Value Ref Range   Sodium 142 135 - 145 mmol/L   Potassium 3.3 (L) 3.5 - 5.1 mmol/L   Chloride 102 98 - 111 mmol/L   CO2 27 22 - 32 mmol/L   Glucose, Bld 153 (H) 70 - 99 mg/dL   BUN 13 8 - 23 mg/dL   Creatinine, Ser 7.82 0.44 - 1.00 mg/dL   Calcium 9.4 8.9 - 95.6 mg/dL   Total Protein 6.7 6.5 - 8.1 g/dL   Albumin 4.2 3.5 - 5.0 g/dL    AST 65 (H) 15 - 41 U/L   ALT 33 0 - 44 U/L   Alkaline Phosphatase 107 38 - 126 U/L   Total Bilirubin 0.6 0.3 - 1.2 mg/dL   GFR, Estimated >21 >30 mL/min   Anion gap 13 5 - 15  POC CBG, ED  Result Value Ref Range   Glucose-Capillary 139 (H) 70 - 99 mg/dL   MR BRAIN WO CONTRAST  Result Date: 07/26/2023 CLINICAL DATA:  Neuro deficit with acute stroke suspected EXAM: MRI HEAD WITHOUT CONTRAST TECHNIQUE: Multiplanar, multiecho pulse sequences of the brain and surrounding structures were obtained without intravenous contrast. COMPARISON:  Head CT from yesterday FINDINGS: Brain: No acute infarction, hemorrhage, hydrocephalus, extra-axial collection or mass lesion. Superior left frontal encephalomalacia, likely post ischemic. Smaller area of cortically based encephalomalacia in the left occipital and lateral right temporal cortex. Cerebral volume loss which is generalized. Vascular: Normal flow voids. Skull and upper cervical spine: Normal marrow signal. Sinuses/Orbits: Negative. Other: Suspect left cheek contusion. Scar-like appearance in the right parietal scalp. IMPRESSION:  1. No acute or reversible finding. 2. 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 Electronically Signed   By: Tiburcio Pea M.D.   On: 07/26/2023 05:12   CT Head Wo Contrast  Result Date: 07/25/2023 CLINICAL DATA:  Neuro deficit with acute stroke suspected. Dizziness EXAM: CT HEAD WITHOUT CONTRAST TECHNIQUE: Contiguous axial images were obtained from the base of the skull through the vertex without intravenous contrast. RADIATION DOSE REDUCTION: This exam was performed according to the departmental dose-optimization program which includes automated exposure control, adjustment of the mA and/or kV according to patient size and/or use of iterative reconstruction technique. COMPARISON:  None Available. FINDINGS: Brain: No evidence of acute infarction, hemorrhage, hydrocephalus, extra-axial collection or mass lesion/mass  effect. Generalized brain atrophy. Vascular: No hyperdense vessel or unexpected calcification. Skull: Normal. Negative for fracture or focal lesion. Sinuses/Orbits: No acute finding. IMPRESSION: No acute finding or explanation for symptoms. Electronically Signed   By: Tiburcio Pea M.D.   On: 07/25/2023 19:46      Dione Booze, MD 07/26/23 [phone]

## 2023-07-26 NOTE — ED Notes (Signed)
Patient ambulated around the nurses station. She stated she felt slightly weak but no dizziness.

## 2023-10-11 ENCOUNTER — Encounter (HOSPITAL_BASED_OUTPATIENT_CLINIC_OR_DEPARTMENT_OTHER): Payer: Self-pay | Admitting: Emergency Medicine

## 2023-10-11 ENCOUNTER — Emergency Department (HOSPITAL_BASED_OUTPATIENT_CLINIC_OR_DEPARTMENT_OTHER)
Admission: EM | Admit: 2023-10-11 | Discharge: 2023-10-11 | Disposition: A | Discharge status: Other Acute Inpatient Hospital | Payer: Medicare Other | Attending: Emergency Medicine | Admitting: Emergency Medicine

## 2023-10-11 ENCOUNTER — Emergency Department (HOSPITAL_BASED_OUTPATIENT_CLINIC_OR_DEPARTMENT_OTHER): Payer: Medicare Other

## 2023-10-11 DIAGNOSIS — Y9301 Activity, walking, marching and hiking: Secondary | ICD-10-CM | POA: Insufficient documentation

## 2023-10-11 DIAGNOSIS — S0181XA Laceration without foreign body of other part of head, initial encounter: Secondary | ICD-10-CM | POA: Diagnosis not present

## 2023-10-11 DIAGNOSIS — Z23 Encounter for immunization: Secondary | ICD-10-CM | POA: Diagnosis not present

## 2023-10-11 DIAGNOSIS — Y9289 Other specified places as the place of occurrence of the external cause: Secondary | ICD-10-CM | POA: Diagnosis not present

## 2023-10-11 DIAGNOSIS — W01110A Fall on same level from slipping, tripping and stumbling with subsequent striking against sharp glass, initial encounter: Secondary | ICD-10-CM | POA: Diagnosis not present

## 2023-10-11 DIAGNOSIS — S01112A Laceration without foreign body of left eyelid and periocular area, initial encounter: Secondary | ICD-10-CM | POA: Diagnosis not present

## 2023-10-11 DIAGNOSIS — E876 Hypokalemia: Secondary | ICD-10-CM

## 2023-10-11 DIAGNOSIS — T148XXA Other injury of unspecified body region, initial encounter: Secondary | ICD-10-CM

## 2023-10-11 DIAGNOSIS — F10929 Alcohol use, unspecified with intoxication, unspecified: Secondary | ICD-10-CM

## 2023-10-11 DIAGNOSIS — W19XXXA Unspecified fall, initial encounter: Secondary | ICD-10-CM

## 2023-10-11 LAB — CBC WITH DIFFERENTIAL/PLATELET
Abs Immature Granulocytes: 0.01 K/uL (ref 0.00–0.07)
Basophils Absolute: 0 K/uL (ref 0.0–0.1)
Basophils Relative: 0 %
Eosinophils Absolute: 0.1 K/uL (ref 0.0–0.5)
Eosinophils Relative: 2 %
HCT: 38 % (ref 36.0–46.0)
Hemoglobin: 12.8 g/dL (ref 12.0–15.0)
Immature Granulocytes: 0 %
Lymphocytes Relative: 54 %
Lymphs Abs: 2.7 K/uL (ref 0.7–4.0)
MCH: 32.6 pg (ref 26.0–34.0)
MCHC: 33.7 g/dL (ref 30.0–36.0)
MCV: 96.7 fL (ref 80.0–100.0)
Monocytes Absolute: 0.3 K/uL (ref 0.1–1.0)
Monocytes Relative: 6 %
Neutro Abs: 1.9 K/uL (ref 1.7–7.7)
Neutrophils Relative %: 38 %
Platelets: 145 K/uL — ABNORMAL LOW (ref 150–400)
RBC: 3.93 MIL/uL (ref 3.87–5.11)
RDW: 12.9 % (ref 11.5–15.5)
WBC: 5 K/uL (ref 4.0–10.5)
nRBC: 0 % (ref 0.0–0.2)

## 2023-10-11 LAB — BASIC METABOLIC PANEL
Anion gap: 14 (ref 5–15)
BUN: 8 mg/dL (ref 8–23)
CO2: 24 mmol/L (ref 22–32)
Calcium: 8.4 mg/dL — ABNORMAL LOW (ref 8.9–10.3)
Chloride: 104 mmol/L (ref 98–111)
Creatinine, Ser: 0.48 mg/dL (ref 0.44–1.00)
GFR, Estimated: >60 mL/min (ref >60)
Glucose, Bld: 88 mg/dL (ref 70–99)
Potassium: 2.9 mmol/L — ABNORMAL LOW (ref 3.5–5.1)
Sodium: 142 mmol/L (ref 135–145)

## 2023-10-11 MED ORDER — CEFAZOLIN SODIUM-DEXTROSE 2-4 GM/100ML-% IV SOLN
2.0000 g | Freq: Once | INTRAVENOUS | Status: AC
  Administered 2023-10-11: 2 g via INTRAVENOUS
  Filled 2023-10-11: qty 100

## 2023-10-11 MED ORDER — FLUORESCEIN SODIUM 1 MG OP STRP
1.0000 | ORAL_STRIP | Freq: Once | OPHTHALMIC | Status: AC
  Administered 2023-10-11: 1 via OPHTHALMIC
  Filled 2023-10-11: qty 1

## 2023-10-11 MED ORDER — TETANUS-DIPHTH-ACELL PERTUSSIS 5-2.5-18.5 LF-MCG/0.5 IM SUSY
0.5000 mL | PREFILLED_SYRINGE | Freq: Once | INTRAMUSCULAR | Status: AC
  Administered 2023-10-11: 0.5 mL via INTRAMUSCULAR
  Filled 2023-10-11: qty 0.5

## 2023-10-11 MED ORDER — LIDOCAINE-EPINEPHRINE-TETRACAINE (LET) TOPICAL GEL
3.0000 mL | Freq: Once | TOPICAL | Status: AC
  Administered 2023-10-11: 3 mL via TOPICAL
  Filled 2023-10-11: qty 3

## 2023-10-11 MED ORDER — POTASSIUM CHLORIDE CRYS ER 20 MEQ PO TBCR
40.0000 meq | EXTENDED_RELEASE_TABLET | Freq: Once | ORAL | Status: AC
  Administered 2023-10-11: 40 meq via ORAL
  Filled 2023-10-11: qty 2

## 2023-10-11 MED ORDER — TETRACAINE HCL 0.5 % OP SOLN
2.0000 [drp] | Freq: Once | OPHTHALMIC | Status: AC
  Administered 2023-10-11: 2 [drp] via OPHTHALMIC
  Filled 2023-10-11: qty 4

## 2023-10-11 MED ORDER — LIDOCAINE-EPINEPHRINE (PF) 2 %-1:200000 IJ SOLN
INTRAMUSCULAR | Status: AC
  Filled 2023-10-11: qty 20

## 2023-10-11 NOTE — ED Triage Notes (Addendum)
Pt walking down hallway with beverage in a glass container, pt fell and landed on glass, cut to left eye and forehead. ETOH onboard.

## 2023-10-11 NOTE — ED Notes (Addendum)
Attempted to give report to [facility] ED RN for patient going ED to ED transfer. Told by RN report has to go through transfer center. Spoke with charge nurse here who called [facility] ED charge nurse back to clarify. Per [facility] ED charge nurse report has already been received from MD Palumbo.

## 2023-10-11 NOTE — ED Provider Notes (Signed)
[place] [facility] AT [facility] Provider Note   CSN: [id number] Arrival date & time: 10/11/23  0025     History  Chief Complaint  Patient presents with   Facial Laceration    Lynn Cummings is a 67 y.o. female.  HPI      Visual Acuity  Right Eye Distance: 20/80 Left Eye Distance: 20/80 (patient not cooperating with the exam. kept both eyes open despite being told to cover the right eye multiple times.) Bilateral Distance: 20/63  Right Eye Near:   Left Eye Near:    Bilateral Near:     Home Medications Prior to Admission medications   Medication Sig Start Date End Date Taking? Authorizing Provider  alendronate (FOSAMAX) 35 MG tablet Take 35 mg by mouth every 7 (seven) days. Take with a full glass of water on an empty stomach.    [provider]  meclizine (ANTIVERT) 25 MG tablet Take 1 tablet (25 mg total) by mouth 3 (three) times daily as needed for dizziness. 07/26/23   Dione Booze, MD  potassium chloride SA (KLOR-CON M) 20 MEQ tablet Take 1 tablet (20 mEq total) by mouth 2 (two) times daily. 07/26/23   Dione Booze, MD  simvastatin (ZOCOR) 10 MG tablet Take 10 mg by mouth daily.    [provider]      Allergies    Patient has no known allergies.    Review of Systems   Review of Systems  Physical Exam Updated Vital Signs There were no vitals taken for this visit. Physical Exam Vitals and nursing note reviewed.  Constitutional:      General: She is not in acute distress.    Appearance: Normal appearance. She is well-developed.  HENT:     Head: Normocephalic.      Nose: Nose normal.  Eyes:     General:        Left eye: Left eye discharge: bloody tears from left eye, 8 mm horizontal laceration of the medial canthus of the left eye.    Intraocular pressure: Left eye pressure is 18 mmHg. Measurements were taken using an automated tonometer.    Extraocular Movements: Extraocular movements intact.      Conjunctiva/sclera:     Left eye: No chemosis or exudate.    Pupils: Pupils are equal, round, and reactive to light.      Comments:   Visual Acuity  Right Eye Distance: 20/80 Left Eye Distance: 20/80 (patient not cooperating with the exam. kept both eyes open despite being told to cover the right eye multiple times.) Bilateral Distance: 20/60  Right Eye Near:   Left Eye Near:    Bilateral Near:      Cardiovascular:     Rate and Rhythm: Normal rate and regular rhythm.     Pulses: Normal pulses.     Heart sounds: Normal heart sounds.  Pulmonary:     Effort: Pulmonary effort is normal. No respiratory distress.     Breath sounds: Normal breath sounds.  Abdominal:     General: Bowel sounds are normal. There is no distension.     Palpations: Abdomen is soft.     Tenderness: There is no abdominal tenderness. There is no guarding or rebound.  Musculoskeletal:        General: Normal range of motion.     Cervical back: Normal range of motion and neck supple.  Skin:    General: Skin is dry.     Capillary Refill: Capillary refill takes  less than 2 seconds.     Findings: No erythema or rash.  Neurological:     General: No focal deficit present.     Mental Status: She is alert.     Deep Tendon Reflexes: Reflexes normal.  Psychiatric:        Mood and Affect: Mood normal.     ED Results / Procedures / Treatments   Labs (all labs ordered are listed, but only abnormal results are displayed)  Results for orders placed or performed during the hospital encounter of 10/11/23  CBC with Differential  Result Value Ref Range   WBC 5.0 4.0 - 10.5 K/uL   RBC 3.93 3.87 - 5.11 MIL/uL   Hemoglobin 12.8 12.0 - 15.0 g/dL   HCT 82.9 56.2 - 13.0 %   MCV 96.7 80.0 - 100.0 fL   MCH 32.6 26.0 - 34.0 pg   MCHC 33.7 30.0 - 36.0 g/dL   RDW 86.5 78.4 - 69.6 %   Platelets 145 (L) 150 - 400 K/uL   nRBC 0.0 0.0 - 0.2 %   Neutrophils Relative % 38 %   Neutro Abs 1.9 1.7 - 7.7 K/uL   Lymphocytes  Relative 54 %   Lymphs Abs 2.7 0.7 - 4.0 K/uL   Monocytes Relative 6 %   Monocytes Absolute 0.3 0.1 - 1.0 K/uL   Eosinophils Relative 2 %   Eosinophils Absolute 0.1 0.0 - 0.5 K/uL   Basophils Relative 0 %   Basophils Absolute 0.0 0.0 - 0.1 K/uL   Immature Granulocytes 0 %   Abs Immature Granulocytes 0.01 0.00 - 0.07 K/uL  Basic metabolic panel  Result Value Ref Range   Sodium 142 135 - 145 mmol/L   Potassium 2.9 (L) 3.5 - 5.1 mmol/L   Chloride 104 98 - 111 mmol/L   CO2 24 22 - 32 mmol/L   Glucose, Bld 88 70 - 99 mg/dL   BUN 8 8 - 23 mg/dL   Creatinine, Ser 2.95 0.44 - 1.00 mg/dL   Calcium 8.4 (L) 8.9 - 10.3 mg/dL   GFR, Estimated >28 >41 mL/min   Anion gap 14 5 - 15   CT Head Wo Contrast  Result Date: 10/11/2023 CLINICAL DATA:  Initial evaluation for acute trauma, fall. EXAM: CT HEAD WITHOUT CONTRAST CT MAXILLOFACIAL WITHOUT CONTRAST CT ORBITS WITHOUT CONTRAST CT CERVICAL SPINE WITHOUT CONTRAST TECHNIQUE: Multidetector CT imaging of the head, cervical spine, and maxillofacial structures were performed using the standard protocol without intravenous contrast. Multiplanar CT image reconstructions of the cervical spine and maxillofacial structures were also generated. RADIATION DOSE REDUCTION: This exam was performed according to the departmental dose-optimization program which includes automated exposure control, adjustment of the mA and/or kV according to patient size and/or use of iterative reconstruction technique. COMPARISON:  None Available. FINDINGS: CT HEAD FINDINGS Brain: Moderately advanced cerebral atrophy. Hypodensity involving the supratentorial cerebral white matter, consistent with chronic small vessel ischemic disease, moderately advanced. No acute intracranial hemorrhage. No acute large vessel territory infarct. No mass lesion or midline shift. Mild ventricular prominence related global parenchymal volume loss without hydrocephalus. No extra-axial fluid collection. Vascular: No  abnormal hyperdense vessel. Scattered vascular calcifications noted within the carotid siphons. Skull: Multifocal soft tissue contusions present about the forehead. Probable small laceration at the left frontal scalp (series 2, image 16). Few superimposed punctate radiopaque densities at this location likely reflect small foreign bodies (series 602, images 43, 44). Calvarium intact without fracture. Other: Mastoid air cells and middle ear cavities are clear.  CT MAXILLOFACIAL AND ORBITS FINDINGS Osseous: Second medic arches intact. No acute maxillary fracture. Nasal bones intact. Right-to-left nasal septal deviation without fracture. Mandible intact. Mandibular condyles are normally situated. No acute abnormality about the remaining dentition. Orbits: Globes intact without traumatic injury. No retro-orbital hematoma or other pathology. Remote left orbital floor fracture noted. No acute fracture about the bony orbits. Sinuses: Paranasal sinuses are largely clear. Soft tissues: Multifocal soft tissue contusions present about the left greater than right forehead. Superimposed laceration with a few small radiopaque foreign bodies present at the left forehead (series 305, images 62, 64). No other visible soft tissue injury. CT CERVICAL SPINE FINDINGS Alignment: Vertebral bodies normally aligned with preservation of the normal cervical lordosis. No listhesis or malalignment. Skull base and vertebrae: Skull base intact. Normal C1-2 articulations are preserved in the dens is intact. Vertebral body heights maintained. No acute fracture. Soft tissues and spinal canal: Soft tissues of the neck demonstrate no acute finding. No abnormal prevertebral edema. Spinal canal within normal limits. Disc levels: Moderate degenerative spondylosis present at C5-6. Mild-to-moderate multilevel left-sided facet hypertrophy. Upper chest: Visualized upper chest demonstrates no acute finding. Partially visualized lung apices are clear. Other:  None. IMPRESSION: CT HEAD: 1. No acute intracranial abnormality. 2. Moderately advanced cerebral atrophy with chronic small vessel ischemic disease. CT MAXILLOFACIAL/ORBITS: 1. No acute maxillofacial fracture. 2. Soft tissue contusions about the left greater than right forehead. Superimposed laceration with a few small radiopaque foreign bodies at the left forehead as above. CT CERVICAL SPINE: 1. No acute traumatic injury within the cervical spine. 2. Moderate degenerative spondylosis at C5-6. Electronically Signed   By: Rise Mu M.D.   On: 10/11/2023 03:05   CT Cervical Spine Wo Contrast  Result Date: 10/11/2023 CLINICAL DATA:  Initial evaluation for acute trauma, fall. EXAM: CT HEAD WITHOUT CONTRAST CT MAXILLOFACIAL WITHOUT CONTRAST CT ORBITS WITHOUT CONTRAST CT CERVICAL SPINE WITHOUT CONTRAST TECHNIQUE: Multidetector CT imaging of the head, cervical spine, and maxillofacial structures were performed using the standard protocol without intravenous contrast. Multiplanar CT image reconstructions of the cervical spine and maxillofacial structures were also generated. RADIATION DOSE REDUCTION: This exam was performed according to the departmental dose-optimization program which includes automated exposure control, adjustment of the mA and/or kV according to patient size and/or use of iterative reconstruction technique. COMPARISON:  None Available. FINDINGS: CT HEAD FINDINGS Brain: Moderately advanced cerebral atrophy. Hypodensity involving the supratentorial cerebral white matter, consistent with chronic small vessel ischemic disease, moderately advanced. No acute intracranial hemorrhage. No acute large vessel territory infarct. No mass lesion or midline shift. Mild ventricular prominence related global parenchymal volume loss without hydrocephalus. No extra-axial fluid collection. Vascular: No abnormal hyperdense vessel. Scattered vascular calcifications noted within the carotid siphons. Skull:  Multifocal soft tissue contusions present about the forehead. Probable small laceration at the left frontal scalp (series 2, image 16). Few superimposed punctate radiopaque densities at this location likely reflect small foreign bodies (series 602, images 43, 44). Calvarium intact without fracture. Other: Mastoid air cells and middle ear cavities are clear. CT MAXILLOFACIAL AND ORBITS FINDINGS Osseous: Second medic arches intact. No acute maxillary fracture. Nasal bones intact. Right-to-left nasal septal deviation without fracture. Mandible intact. Mandibular condyles are normally situated. No acute abnormality about the remaining dentition. Orbits: Globes intact without traumatic injury. No retro-orbital hematoma or other pathology. Remote left orbital floor fracture noted. No acute fracture about the bony orbits. Sinuses: Paranasal sinuses are largely clear. Soft tissues: Multifocal soft tissue contusions present about the left  greater than right forehead. Superimposed laceration with a few small radiopaque foreign bodies present at the left forehead (series 305, images 62, 64). No other visible soft tissue injury. CT CERVICAL SPINE FINDINGS Alignment: Vertebral bodies normally aligned with preservation of the normal cervical lordosis. No listhesis or malalignment. Skull base and vertebrae: Skull base intact. Normal C1-2 articulations are preserved in the dens is intact. Vertebral body heights maintained. No acute fracture. Soft tissues and spinal canal: Soft tissues of the neck demonstrate no acute finding. No abnormal prevertebral edema. Spinal canal within normal limits. Disc levels: Moderate degenerative spondylosis present at C5-6. Mild-to-moderate multilevel left-sided facet hypertrophy. Upper chest: Visualized upper chest demonstrates no acute finding. Partially visualized lung apices are clear. Other: None. IMPRESSION: CT HEAD: 1. No acute intracranial abnormality. 2. Moderately advanced cerebral atrophy  with chronic small vessel ischemic disease. CT MAXILLOFACIAL/ORBITS: 1. No acute maxillofacial fracture. 2. Soft tissue contusions about the left greater than right forehead. Superimposed laceration with a few small radiopaque foreign bodies at the left forehead as above. CT CERVICAL SPINE: 1. No acute traumatic injury within the cervical spine. 2. Moderate degenerative spondylosis at C5-6. Electronically Signed   By: Rise Mu M.D.   On: 10/11/2023 03:05   CT Maxillofacial Wo Contrast  Result Date: 10/11/2023 CLINICAL DATA:  Initial evaluation for acute trauma, fall. EXAM: CT HEAD WITHOUT CONTRAST CT MAXILLOFACIAL WITHOUT CONTRAST CT ORBITS WITHOUT CONTRAST CT CERVICAL SPINE WITHOUT CONTRAST TECHNIQUE: Multidetector CT imaging of the head, cervical spine, and maxillofacial structures were performed using the standard protocol without intravenous contrast. Multiplanar CT image reconstructions of the cervical spine and maxillofacial structures were also generated. RADIATION DOSE REDUCTION: This exam was performed according to the departmental dose-optimization program which includes automated exposure control, adjustment of the mA and/or kV according to patient size and/or use of iterative reconstruction technique. COMPARISON:  None Available. FINDINGS: CT HEAD FINDINGS Brain: Moderately advanced cerebral atrophy. Hypodensity involving the supratentorial cerebral white matter, consistent with chronic small vessel ischemic disease, moderately advanced. No acute intracranial hemorrhage. No acute large vessel territory infarct. No mass lesion or midline shift. Mild ventricular prominence related global parenchymal volume loss without hydrocephalus. No extra-axial fluid collection. Vascular: No abnormal hyperdense vessel. Scattered vascular calcifications noted within the carotid siphons. Skull: Multifocal soft tissue contusions present about the forehead. Probable small laceration at the left frontal  scalp (series 2, image 16). Few superimposed punctate radiopaque densities at this location likely reflect small foreign bodies (series 602, images 43, 44). Calvarium intact without fracture. Other: Mastoid air cells and middle ear cavities are clear. CT MAXILLOFACIAL AND ORBITS FINDINGS Osseous: Second medic arches intact. No acute maxillary fracture. Nasal bones intact. Right-to-left nasal septal deviation without fracture. Mandible intact. Mandibular condyles are normally situated. No acute abnormality about the remaining dentition. Orbits: Globes intact without traumatic injury. No retro-orbital hematoma or other pathology. Remote left orbital floor fracture noted. No acute fracture about the bony orbits. Sinuses: Paranasal sinuses are largely clear. Soft tissues: Multifocal soft tissue contusions present about the left greater than right forehead. Superimposed laceration with a few small radiopaque foreign bodies present at the left forehead (series 305, images 62, 64). No other visible soft tissue injury. CT CERVICAL SPINE FINDINGS Alignment: Vertebral bodies normally aligned with preservation of the normal cervical lordosis. No listhesis or malalignment. Skull base and vertebrae: Skull base intact. Normal C1-2 articulations are preserved in the dens is intact. Vertebral body heights maintained. No acute fracture. Soft tissues and spinal canal: Soft  tissues of the neck demonstrate no acute finding. No abnormal prevertebral edema. Spinal canal within normal limits. Disc levels: Moderate degenerative spondylosis present at C5-6. Mild-to-moderate multilevel left-sided facet hypertrophy. Upper chest: Visualized upper chest demonstrates no acute finding. Partially visualized lung apices are clear. Other: None. IMPRESSION: CT HEAD: 1. No acute intracranial abnormality. 2. Moderately advanced cerebral atrophy with chronic small vessel ischemic disease. CT MAXILLOFACIAL/ORBITS: 1. No acute maxillofacial fracture. 2.  Soft tissue contusions about the left greater than right forehead. Superimposed laceration with a few small radiopaque foreign bodies at the left forehead as above. CT CERVICAL SPINE: 1. No acute traumatic injury within the cervical spine. 2. Moderate degenerative spondylosis at C5-6. Electronically Signed   By: Rise Mu M.D.   On: 10/11/2023 03:05   CT Orbits Wo Contrast  Result Date: 10/11/2023 CLINICAL DATA:  Initial evaluation for acute trauma, fall. EXAM: CT HEAD WITHOUT CONTRAST CT MAXILLOFACIAL WITHOUT CONTRAST CT ORBITS WITHOUT CONTRAST CT CERVICAL SPINE WITHOUT CONTRAST TECHNIQUE: Multidetector CT imaging of the head, cervical spine, and maxillofacial structures were performed using the standard protocol without intravenous contrast. Multiplanar CT image reconstructions of the cervical spine and maxillofacial structures were also generated. RADIATION DOSE REDUCTION: This exam was performed according to the departmental dose-optimization program which includes automated exposure control, adjustment of the mA and/or kV according to patient size and/or use of iterative reconstruction technique. COMPARISON:  None Available. FINDINGS: CT HEAD FINDINGS Brain: Moderately advanced cerebral atrophy. Hypodensity involving the supratentorial cerebral white matter, consistent with chronic small vessel ischemic disease, moderately advanced. No acute intracranial hemorrhage. No acute large vessel territory infarct. No mass lesion or midline shift. Mild ventricular prominence related global parenchymal volume loss without hydrocephalus. No extra-axial fluid collection. Vascular: No abnormal hyperdense vessel. Scattered vascular calcifications noted within the carotid siphons. Skull: Multifocal soft tissue contusions present about the forehead. Probable small laceration at the left frontal scalp (series 2, image 16). Few superimposed punctate radiopaque densities at this location likely reflect small  foreign bodies (series 602, images 43, 44). Calvarium intact without fracture. Other: Mastoid air cells and middle ear cavities are clear. CT MAXILLOFACIAL AND ORBITS FINDINGS Osseous: Second medic arches intact. No acute maxillary fracture. Nasal bones intact. Right-to-left nasal septal deviation without fracture. Mandible intact. Mandibular condyles are normally situated. No acute abnormality about the remaining dentition. Orbits: Globes intact without traumatic injury. No retro-orbital hematoma or other pathology. Remote left orbital floor fracture noted. No acute fracture about the bony orbits. Sinuses: Paranasal sinuses are largely clear. Soft tissues: Multifocal soft tissue contusions present about the left greater than right forehead. Superimposed laceration with a few small radiopaque foreign bodies present at the left forehead (series 305, images 62, 64). No other visible soft tissue injury. CT CERVICAL SPINE FINDINGS Alignment: Vertebral bodies normally aligned with preservation of the normal cervical lordosis. No listhesis or malalignment. Skull base and vertebrae: Skull base intact. Normal C1-2 articulations are preserved in the dens is intact. Vertebral body heights maintained. No acute fracture. Soft tissues and spinal canal: Soft tissues of the neck demonstrate no acute finding. No abnormal prevertebral edema. Spinal canal within normal limits. Disc levels: Moderate degenerative spondylosis present at C5-6. Mild-to-moderate multilevel left-sided facet hypertrophy. Upper chest: Visualized upper chest demonstrates no acute finding. Partially visualized lung apices are clear. Other: None. IMPRESSION: CT HEAD: 1. No acute intracranial abnormality. 2. Moderately advanced cerebral atrophy with chronic small vessel ischemic disease. CT MAXILLOFACIAL/ORBITS: 1. No acute maxillofacial fracture. 2. Soft tissue contusions about the  left greater than right forehead. Superimposed laceration with a few small  radiopaque foreign bodies at the left forehead as above. CT CERVICAL SPINE: 1. No acute traumatic injury within the cervical spine. 2. Moderate degenerative spondylosis at C5-6. Electronically Signed   By: Rise Mu M.D.   On: 10/11/2023 03:05    Procedures .Marland KitchenLaceration Repair  Date/Time: 10/11/2023 3:35 AM  Performed by: Cy Blamer, MD Authorized by: Cy Blamer, MD   Consent:    Consent obtained:  Verbal   Consent given by:  Patient and spouse   Risks discussed:  Infection, need for additional repair, nerve damage, poor wound healing, poor cosmetic result, pain and retained foreign body (there are deep retained FB.  Superficial FB removed with Irrigation.  Patient and husband informed of FB)   Alternatives discussed:  No treatment and delayed treatment Universal protocol:    Procedure explained and questions answered to patient or proxy's satisfaction: yes     Patient identity confirmed:  Arm band Anesthesia:    Anesthesia method:  Local infiltration   Local anesthetic:  Lidocaine 1% WITH epi Laceration details:    Location:  Face   Face location:  Forehead   Length (cm):  4.5   Depth (mm):  1 Pre-procedure details:    Preparation:  Patient was prepped and draped in usual sterile fashion Exploration:    Hemostasis achieved with:  Direct pressure   Wound extent: fascia not violated and no foreign body   Treatment:    Area cleansed with:  Povidone-iodine, chlorhexidine and saline   Amount of cleaning:  Extensive   Irrigation solution:  Sterile saline   Irrigation method:  Syringe   Debridement:  None Skin repair:    Repair method:  Tissue adhesive Approximation:    Approximation:  Close Repair type:    Repair type:  Intermediate Post-procedure details:    Procedure completion:  Tolerated well, no immediate complications .Marland KitchenLaceration Repair  Date/Time: 10/11/2023 3:37 AM  Performed by: Cy Blamer, MD Authorized by: Cy Blamer, MD   Consent:     Consent obtained:  Verbal   Risks discussed:  Infection, need for additional repair, nerve damage, poor wound healing, poor cosmetic result, pain, retained foreign body, tendon damage and vascular damage (patient and husband informed of deep retained glass on CT)   Alternatives discussed:  Delayed treatment Universal protocol:    Patient identity confirmed:  Arm band Laceration details:    Location:  Face   Face location:  L eyebrow   Length (cm):  3 (stellate)   Depth (mm):  1 Pre-procedure details:    Preparation:  Patient was prepped and draped in usual sterile fashion Exploration:    Hemostasis achieved with:  Direct pressure   Wound extent: no nerve damage, no tendon damage, no underlying fracture and no vascular damage   Treatment:    Area cleansed with:  Povidone-iodine, chlorhexidine and saline   Amount of cleaning:  Extensive   Irrigation solution:  Sterile saline   Irrigation method:  Syringe   Debridement:  None Skin repair:    Repair method:  Sutures   Suture size:  6-0   Suture material:  Nylon   Suture technique:  Simple interrupted   Number of sutures:  7 Approximation:    Approximation:  Close Repair type:    Repair type:  Intermediate Post-procedure details:    Dressing:  Sterile dressing     Medications Ordered in ED Medications  Tdap (BOOSTRIX) injection 0.5 mL (0.5  mLs Intramuscular Given 10/11/23 0036)  fluorescein ophthalmic strip 1 strip (1 strip Left Eye Given 10/11/23 0041)  tetracaine (PONTOCAINE) 0.5 % ophthalmic solution 2 drop (2 drops Left Eye Given 10/11/23 0041)  lidocaine-EPINEPHrine (XYLOCAINE W/EPI) 2 %-1:200000 (PF) injection (  Given by Other 10/11/23 0330)  ceFAZolin (ANCEF) IVPB 2g/100 mL premix (0 g Intravenous Stopped 10/11/23 0442)  potassium chloride SA (KLOR-CON M) CR tablet 40 mEq (40 mEq Oral Given 10/11/23 0439)  lidocaine-EPINEPHrine-tetracaine (LET) topical gel (3 mLs Topical Given 10/11/23 0445)     ED Course/ Medical Decision  Making/ A&P                                 Medical Decision Making Amount and/or Complexity of Data Reviewed Labs: ordered.    Details: White count is 5 normal, normal hemoglobin 12.8, platelets slightly low 145.  Normal sodium 142, low potassium 2.9, normal creatinine 0.48 Radiology: ordered and independent interpretation performed.    Details: Negative head CT by me, atrophy Globe is intact on CT orbits.   Discussion of management or test interpretation with external provider(s): Case d/w Dr. Allena Katz of ophthalmology, patient will need oculoplastics and there are none on call at [facility].   Case d/w Dr. Cleophas Dunker of ophthalmology at [facility] who agrees to see the patient. Patient to be transferred ED to ED   Risk Prescription drug management.    Final Clinical Impression(s) / ED Diagnoses Final diagnoses:  None   Patient accepted to [facility] [facility] for [facility] with oculoplastics.   Rx / DC Orders ED Discharge Orders     None         Matisha Termine, MD 10/11/23 [id number]

## 2024-05-08 ENCOUNTER — Ambulatory Visit (HOSPITAL_COMMUNITY)
Admission: EM | Admit: 2024-05-08 | Discharge: 2024-05-08 | Disposition: A | Discharge status: Home / Self Care | Attending: Psychiatry | Admitting: Psychiatry

## 2024-05-08 ENCOUNTER — Other Ambulatory Visit (HOSPITAL_COMMUNITY)
Admission: EM | Admit: 2024-05-08 | Discharge: 2024-05-13 | Disposition: A | Discharge status: Home / Self Care | Attending: Psychiatry | Admitting: Psychiatry

## 2024-05-08 DIAGNOSIS — F1020 Alcohol dependence, uncomplicated: Secondary | ICD-10-CM | POA: Insufficient documentation

## 2024-05-08 DIAGNOSIS — F1090 Alcohol use, unspecified, uncomplicated: Secondary | ICD-10-CM

## 2024-05-08 DIAGNOSIS — Z79899 Other long term (current) drug therapy: Secondary | ICD-10-CM | POA: Diagnosis not present

## 2024-05-08 DIAGNOSIS — F1010 Alcohol abuse, uncomplicated: Secondary | ICD-10-CM | POA: Diagnosis not present

## 2024-05-08 DIAGNOSIS — F330 Major depressive disorder, recurrent, mild: Secondary | ICD-10-CM | POA: Insufficient documentation

## 2024-05-08 DIAGNOSIS — R296 Repeated falls: Secondary | ICD-10-CM | POA: Diagnosis not present

## 2024-05-08 DIAGNOSIS — F1994 Other psychoactive substance use, unspecified with psychoactive substance-induced mood disorder: Secondary | ICD-10-CM | POA: Diagnosis not present

## 2024-05-08 LAB — COMPREHENSIVE METABOLIC PANEL WITH GFR
ALT: 20 U/L (ref 0–44)
AST: 43 U/L — ABNORMAL HIGH (ref 15–41)
Albumin: 4.4 g/dL (ref 3.5–5.0)
Alkaline Phosphatase: 105 U/L (ref 38–126)
Anion gap: 19 — ABNORMAL HIGH (ref 5–15)
BUN: 10 mg/dL (ref 8–23)
CO2: 23 mmol/L (ref 22–32)
Calcium: 9.6 mg/dL (ref 8.9–10.3)
Chloride: 102 mmol/L (ref 98–111)
Creatinine, Ser: 0.78 mg/dL (ref 0.44–1.00)
GFR, Estimated: >60 mL/min (ref >60)
Glucose, Bld: 71 mg/dL (ref 70–99)
Potassium: 3.5 mmol/L (ref 3.5–5.1)
Sodium: 144 mmol/L (ref 135–145)
Total Bilirubin: 0.4 mg/dL (ref 0.0–1.2)
Total Protein: 7.3 g/dL (ref 6.5–8.1)

## 2024-05-08 LAB — CBC WITH DIFFERENTIAL/PLATELET
Abs Immature Granulocytes: 0.01 10*3/uL (ref 0.00–0.07)
Basophils Absolute: 0 10*3/uL (ref 0.0–0.1)
Basophils Relative: 1 %
Eosinophils Absolute: 0.1 10*3/uL (ref 0.0–0.5)
Eosinophils Relative: 1 %
HCT: 39.2 % (ref 36.0–46.0)
Hemoglobin: 12.9 g/dL (ref 12.0–15.0)
Immature Granulocytes: 0 %
Lymphocytes Relative: 30 %
Lymphs Abs: 1.4 10*3/uL (ref 0.7–4.0)
MCH: 32.3 pg (ref 26.0–34.0)
MCHC: 32.9 g/dL (ref 30.0–36.0)
MCV: 98 fL (ref 80.0–100.0)
Monocytes Absolute: 0.4 10*3/uL (ref 0.1–1.0)
Monocytes Relative: 8 %
Neutro Abs: 2.9 10*3/uL (ref 1.7–7.7)
Neutrophils Relative %: 60 %
Platelets: 183 10*3/uL (ref 150–400)
RBC: 4 MIL/uL (ref 3.87–5.11)
RDW: 14 % (ref 11.5–15.5)
WBC: 4.7 10*3/uL (ref 4.0–10.5)
nRBC: 0 % (ref 0.0–0.2)

## 2024-05-08 LAB — MAGNESIUM: Magnesium: 1.6 mg/dL — ABNORMAL LOW (ref 1.7–2.4)

## 2024-05-08 LAB — HEMOGLOBIN A1C
Hgb A1c MFr Bld: 5 % (ref 4.8–5.6)
Mean Plasma Glucose: 96.8 mg/dL

## 2024-05-08 LAB — ETHANOL: Alcohol, Ethyl (B): 78 mg/dL — ABNORMAL HIGH (ref <15)

## 2024-05-08 MED ORDER — LORAZEPAM 1 MG PO TABS: 1.0000 mg | ORAL_TABLET | Freq: Four times a day (QID) | ORAL | Status: DC | PRN

## 2024-05-08 MED ORDER — ADULT MULTIVITAMIN W/MINERALS CH
1.0000 | ORAL_TABLET | Freq: Every day | ORAL | Status: DC
  Administered 2024-05-09 – 2024-05-13 (×5): 1 via ORAL
  Filled 2024-05-08 (×4): qty 1

## 2024-05-08 MED ORDER — ALUM & MAG HYDROXIDE-SIMETH 200-200-20 MG/5ML PO SUSP: 30.0000 mL | ORAL | Status: DC | PRN

## 2024-05-08 MED ORDER — THIAMINE HCL 100 MG/ML IJ SOLN
100.0000 mg | Freq: Once | INTRAMUSCULAR | Status: AC
  Administered 2024-05-09: 100 mg via INTRAMUSCULAR
  Filled 2024-05-08: qty 2

## 2024-05-08 MED ORDER — ADULT MULTIVITAMIN W/MINERALS CH: 1.0000 | ORAL_TABLET | Freq: Every day | ORAL | Status: DC

## 2024-05-08 MED ORDER — ACETAMINOPHEN 325 MG PO TABS: 650.0000 mg | ORAL_TABLET | Freq: Four times a day (QID) | ORAL | Status: DC | PRN

## 2024-05-08 MED ORDER — ONDANSETRON 4 MG PO TBDP: 4.0000 mg | ORAL_TABLET | Freq: Four times a day (QID) | ORAL | Status: DC | PRN

## 2024-05-08 MED ORDER — MAGNESIUM HYDROXIDE 400 MG/5ML PO SUSP: 30.0000 mL | Freq: Every day | ORAL | Status: DC | PRN

## 2024-05-08 MED ORDER — LORAZEPAM 1 MG PO TABS: 1.0000 mg | ORAL_TABLET | Freq: Four times a day (QID) | ORAL | Status: AC | PRN

## 2024-05-08 MED ORDER — THIAMINE MONONITRATE 100 MG PO TABS: 100.0000 mg | ORAL_TABLET | Freq: Every day | ORAL | Status: DC

## 2024-05-08 MED ORDER — OLANZAPINE 5 MG PO TBDP: 5.0000 mg | ORAL_TABLET | Freq: Three times a day (TID) | ORAL | Status: DC | PRN

## 2024-05-08 MED ORDER — HYDROXYZINE HCL 25 MG PO TABS
25.0000 mg | ORAL_TABLET | Freq: Four times a day (QID) | ORAL | Status: DC | PRN
Start: 2024-05-08 — End: 2024-05-08

## 2024-05-08 MED ORDER — OLANZAPINE 10 MG IM SOLR: 5.0000 mg | Freq: Three times a day (TID) | INTRAMUSCULAR | Status: DC | PRN

## 2024-05-08 MED ORDER — LOPERAMIDE HCL 2 MG PO CAPS: 2.0000 mg | ORAL_CAPSULE | ORAL | Status: AC | PRN

## 2024-05-08 MED ORDER — THIAMINE HCL 100 MG/ML IJ SOLN: 100.0000 mg | Freq: Once | INTRAMUSCULAR | Status: DC

## 2024-05-08 MED ORDER — THIAMINE MONONITRATE 100 MG PO TABS
100.0000 mg | ORAL_TABLET | Freq: Every day | ORAL | Status: DC
  Administered 2024-05-09 – 2024-05-13 (×5): 100 mg via ORAL
  Filled 2024-05-08 (×5): qty 1

## 2024-05-08 MED ORDER — HYDROXYZINE HCL 25 MG PO TABS
25.0000 mg | ORAL_TABLET | Freq: Four times a day (QID) | ORAL | Status: AC | PRN
  Administered 2024-05-09 – 2024-05-10 (×2): 25 mg via ORAL
  Filled 2024-05-08 (×2): qty 1

## 2024-05-08 MED ORDER — ONDANSETRON 4 MG PO TBDP: 4.0000 mg | ORAL_TABLET | Freq: Four times a day (QID) | ORAL | Status: AC | PRN

## 2024-05-08 MED ORDER — OLANZAPINE 10 MG IM SOLR
5.0000 mg | Freq: Three times a day (TID) | INTRAMUSCULAR | Status: DC | PRN
Start: 2024-05-08 — End: 2024-05-08

## 2024-05-08 MED ORDER — LOPERAMIDE HCL 2 MG PO CAPS: 2.0000 mg | ORAL_CAPSULE | ORAL | Status: DC | PRN

## 2024-05-08 NOTE — ED Provider Notes (Signed)
 Facility Based Crisis Admission H&P  Date: 05/08/24 Patient Name: Lynn Cummings MRN: [id number] Chief Complaint: Alcohol abuse  Diagnoses:  Final diagnoses:  None    HPI: Lynn Cummings is a 68 year-old female who presents to [facility] voluntarily, accompanied by her husband Jaimi Belle [phone], with chief complaint of alcohol abuse. She reports that this is the first time she seeks help with her alcohol problem. Patient reports she has never had any treatment for her alcohol problem. Reports that she has been drinking  for many years but for the last couple of days, the use has increased more and more and safety is becoming an issue.   Patient allows spouse to provide information regarding her drinking problem. Spouse reports that they have been married for over 30 years. Spouse does not use alcohol.  Patient has used alcohol for many years "we thought it was just the young age, and I stayed non-judgemental, because I love and care about her.  For the past couple of years, she has been drinking  and falling down a lot. Yesterday, I found her sitting on the toilet, all wet, and looked sad, and she did not look herself. She was confused and could hardly talk. She did not eat, was just drinking. She usually drinks Vodka, every day".  Assessment: 68 year-old female sitting in the assessment room. She allows her spouse to participate in this evaluation.  Patient is receptive and cooperative upon approach. Alert and oriented x 4. She appears tired, helpless and expressing remorse related to her alcohol abuse. She appears older than her stated age.  Speech is soft and slowed. Eye contact is fair. Patient is fairly dressed and groomed. She does not appear to be preoccupied, or responding to internal stimuli.  Patient denies thoughts of self-harm, past or present. Denies HI/AVH. Denies hx of mental illness.  Patient reports this is the first time she seeks help with her alcohol problem  "because its becoming dangerous". Admits to experiencing multiple falls  resulting in injuries when intoxicated and currently under medical treatment for left eye injury. Admits to drinking vodka every day. Denies using beer or wine.  Reports no trigger. Reports no hx of depression or anxiety.   Patient is retired and stays home with their 2 dogs. She buys  alcohol at the liquor store, and drinks throughout the day when spouse is at work.  She denies hx of addiction in her family of origin. Patient and spouse have been married for over 30 years and have no children.  Parents and siblings are deceased. Spouse is supportive and encouraging.   Treatment options are provided through patient education and she reports she would like to start with detox and not decided about rehab services for now. We will admit her to [facility] and initiate Ativan detox protocol.   PHQ 2-9:   Flowsheet Row ED from 05/08/2024 in [facility] ED from 10/11/2023 in [facility] Emergency Department at [facility] ED from 07/25/2023 in [facility] Emergency Department at [facility]  C-SSRS RISK CATEGORY No Risk No Risk No Risk         Total Time spent with patient: 1 hour  Musculoskeletal  Strength & Muscle Tone: within normal limits Gait & Station: normal Patient leans: N/A  Psychiatric Specialty Exam  Presentation General Appearance:  Appropriate for Environment  Eye Contact: Fair  Speech: Clear and Coherent  Speech Volume: Normal  Handedness: Right   Mood and Affect  Mood: Anxious  Affect: Appropriate   Thought Process  Thought Processes: Coherent; Goal Directed  Descriptions of Associations:Intact  Orientation:Full (Time, Place and Person)  Thought Content:WDL    Hallucinations:Hallucinations: None  Ideas of Reference:None  Suicidal Thoughts:Suicidal Thoughts: No  Homicidal Thoughts:Homicidal Thoughts: No   Sensorium  Memory: Immediate  Fair; Recent Fair; Remote Fair  Judgment: Fair  Insight: Fair   Art therapist  Concentration: Fair  Attention Span: Fair  Recall: [employer] of Knowledge: Fair  Language: Fair   Psychomotor Activity  Psychomotor Activity: Psychomotor Activity: Normal   Assets  Assets: Communication Skills; Desire for Improvement; Financial Resources/Insurance; Housing; Physical Health; Social Support; Transportation   Sleep  Sleep: Sleep: Fair   Nutritional Assessment (For OBS and FBC admissions only) Has the patient had a weight loss or gain of 10 pounds or more in the last 3 months?: No Has the patient had a decrease in food intake/or appetite?: Yes Does the patient have dental problems?: No Does the patient have eating habits or behaviors that may be indicators of an eating disorder including binging or inducing vomiting?: No Has the patient recently lost weight without trying?: 0 Has the patient been eating poorly because of a decreased appetite?: 1 Malnutrition Screening Tool Score: 1    Physical Exam Vitals and nursing note reviewed.  Constitutional:      Appearance: Normal appearance.  HENT:     Head: Normocephalic and atraumatic.     Right Ear: Tympanic membrane normal.     Left Ear: Tympanic membrane normal.     Nose: Nose normal.     Mouth/Throat:     Mouth: Mucous membranes are moist.  Eyes:     Pupils: Pupils are equal, round, and reactive to light.  Cardiovascular:     Rate and Rhythm: Normal rate.     Pulses: Normal pulses.  Pulmonary:     Effort: Pulmonary effort is normal.  Musculoskeletal:        General: Normal range of motion.     Cervical back: Normal range of motion.  Neurological:     Mental Status: She is alert.  Psychiatric:        Thought Content: Thought content normal.    Review of Systems  Constitutional: Negative.   HENT: Negative.    Eyes: Negative.   Respiratory: Negative.    Cardiovascular: Negative.    Gastrointestinal: Negative.   Genitourinary: Negative.   Musculoskeletal: Negative.   Skin: Negative.   Neurological: Negative.   Endo/Heme/Allergies: Negative.   Psychiatric/Behavioral:  Positive for substance abuse. The patient is nervous/anxious.     Blood pressure 124/85, pulse 83, temperature 98.7 F (37.1 C), temperature source Oral, resp. rate 20, SpO2 99%. There is no height or weight on file to calculate BMI.  Past Psychiatric History: NA   Is the patient at risk to self? No  Has the patient been a risk to self in the past 6 months? No .    Has the patient been a risk to self within the distant past? No   Is the patient a risk to others? No   Has the patient been a risk to others in the past 6 months? No   Has the patient been a risk to others within the distant past? No   Past Medical History: NA Family History: NA Social History: Retired. Married, no children.   Last Labs:  No visits with results within 6 Month(s) from this visit.  Latest known visit with  results is:  Admission on 10/11/2023, Discharged on 10/11/2023  Component Date Value Ref Range Status   WBC 10/11/2023 5.0  4.0 - 10.5 K/uL Final   RBC 10/11/2023 3.93  3.87 - 5.11 MIL/uL Final   Hemoglobin 10/11/2023 12.8  12.0 - 15.0 g/dL Final   HCT 82/95/6213 38.0  36.0 - 46.0 % Final   MCV 10/11/2023 96.7  80.0 - 100.0 fL Final   MCH 10/11/2023 32.6  26.0 - 34.0 pg Final   MCHC 10/11/2023 33.7  30.0 - 36.0 g/dL Final   RDW 08/65/7846 12.9  11.5 - 15.5 % Final   Platelets 10/11/2023 145 (L)  150 - 400 K/uL Final   nRBC 10/11/2023 0.0  0.0 - 0.2 % Final   Neutrophils Relative % 10/11/2023 38  % Final   Neutro Abs 10/11/2023 1.9  1.7 - 7.7 K/uL Final   Lymphocytes Relative 10/11/2023 54  % Final   Lymphs Abs 10/11/2023 2.7  0.7 - 4.0 K/uL Final   Monocytes Relative 10/11/2023 6  % Final   Monocytes Absolute 10/11/2023 0.3  0.1 - 1.0 K/uL Final   Eosinophils Relative 10/11/2023 2  % Final   Eosinophils  Absolute 10/11/2023 0.1  0.0 - 0.5 K/uL Final   Basophils Relative 10/11/2023 0  % Final   Basophils Absolute 10/11/2023 0.0  0.0 - 0.1 K/uL Final   Immature Granulocytes 10/11/2023 0  % Final   Abs Immature Granulocytes 10/11/2023 0.01  0.00 - 0.07 K/uL Final   Performed at [facility], [address] [place], [place] [address]   Sodium 10/11/2023 142  135 - 145 mmol/L Final   Potassium 10/11/2023 2.9 (L)  3.5 - 5.1 mmol/L Final   Chloride 10/11/2023 104  98 - 111 mmol/L Final   CO2 10/11/2023 24  22 - 32 mmol/L Final   Glucose, Bld 10/11/2023 88  70 - 99 mg/dL Final   Glucose reference range applies only to samples taken after fasting for at least 8 hours.   BUN 10/11/2023 8  8 - 23 mg/dL Final   Creatinine, Ser 10/11/2023 0.48  0.44 - 1.00 mg/dL Final   Calcium 28/41/3244 8.4 (L)  8.9 - 10.3 mg/dL Final   GFR, Estimated 10/11/2023 >60  >60 mL/min Final   Comment: (NOTE) Calculated using the CKD-EPI Creatinine Equation (2021)    Anion gap 10/11/2023 14  5 - 15 Final   Performed at [facility], [address] [place], [place] [address]    Allergies: Patient has no known allergies.  Medications:  PTA Medications  Medication Sig   alendronate (FOSAMAX) 35 MG tablet Take 35 mg by mouth every 7 (seven) days. Take with a full glass of water on an empty stomach.   simvastatin (ZOCOR) 10 MG tablet Take 10 mg by mouth daily.   meclizine  (ANTIVERT ) 25 MG tablet Take 1 tablet (25 mg total) by mouth 3 (three) times daily as needed for dizziness.   potassium chloride  SA (KLOR-CON  M) 20 MEQ tablet Take 1 tablet (20 mEq total) by mouth 2 (two) times daily.    Long Term Goals: Improvement in symptoms so as ready for discharge  Short Term Goals: Patient will verbalize feelings in meetings with treatment team members., Patient will attend at least of 50% of the groups daily., Pt will complete the PHQ9 on admission, day 3 and discharge., Patient will participate in  completing the [place] Suicide Severity Rating Scale, Patient will score a low risk  of violence for 24 hours prior to discharge, and Patient will take medications as prescribed daily.  Medical Decision Making  Admit to [facility]  and initiate safety precautions Initiate Ativan detox protocol Initiate agitation protocol Acetaminophen 650 mg PO Q 6 PRN Maalox 30 ml PO Q 4 PRN Milk of Magnesia 30 ml PO  daily PRN  Labs: CBC, CMP, RPR, TSH, A1C, UDS, UA, hepatic function panel, Lipid Panel, magnesium, Ethanol   EKG     Recommendations  Based on my evaluation the patient does not appear to have an emergency medical condition.  Elston Halsted, NP 05/08/24  8:40 PM

## 2024-05-08 NOTE — Progress Notes (Signed)
   05/08/24 1804  [facility] Triage Screening (Walk-ins at [facility] only)  How Did You Hear About Us ? Family/Friend  What Is the Reason for Your Visit/Call Today? Sirmon is a 68 year old female presenting to [facility] accompanied by her husband. Pt reports she is needing detox. Pt reports she drank alcohol at 1 am this morning. Pt reports she is drinking 3-4 drinks per day. Pt states that she has been drinking for 10-15 years now. Pt states that she is not currently experiencing withdrawls. Pt mentions that she is not seeing a therapist or taking medication for anxiety/depression. Pt states that she is looking for an inpatient treatment center at this time of triage. Pt has no known mental health diagnoses.  How Long Has This Been Causing You Problems? <Week  Have You Recently Had Any Thoughts About Hurting Yourself? No  Are You Planning to Commit Suicide/Harm Yourself At This time? No  Have you Recently Had Thoughts About Hurting Someone Marigene Shoulder? No  Are You Planning To Harm Someone At This Time? No  Physical Abuse Denies  Verbal Abuse Denies  Sexual Abuse Denies  Exploitation of patient/patient's resources Denies  Self-Neglect Denies  Possible abuse reported to: Other (Comment)  Are you currently experiencing any auditory, visual or other hallucinations? No  Have You Used Any Alcohol or Drugs in the Past 24 Hours? Yes  What Did You Use and How Much? 3-4 glasses of vodka  Do you have any current medical co-morbidities that require immediate attention? No  Clinician description of patient physical appearance/behavior: calm, cooperative  What Do You Feel Would Help You the Most Today? Alcohol or Drug Use Treatment  If access to [facility] was not available, would you have sought care in the Emergency Department? No  Determination of Need Urgent (48 hours)  Options For Referral Facility-Based Crisis  Determination of Need filed? Yes

## 2024-05-08 NOTE — BH Assessment (Signed)
 Comprehensive Clinical Assessment (CCA) Note  05/08/2024 Lynn Cummings [id number]  Disposition: Lynn Halsted, NP recommends pt to be admitted to Facility Based Crisis.   The patient demonstrates the following risk factors for suicide: Chronic risk factors for suicide include: substance use disorder. Acute risk factors for suicide include: N/A. Protective factors for this patient include: positive social support. Considering these factors, the overall suicide risk at this point appears to be no risk. Patient is not appropriate for outpatient follow up.  Lynn Cummings is a 68 year old female who presents voluntary and accompanied by her husband Lynn Cummings, [phone]) to [facility]). Clinician asked the pt, "what brought you to the hospital?"' Pt reports, Pt reports, she wants to stop drinking. Pt denies, stressors, SI, HI, hallucinations, self-injurious behaviors and access to weapons.   Pt denies, being linked to OPT resources (medication management and/or counseling.) Pt reports, drinking three mixed drinks last night. Pt reports, she drinks everyday. Pt denies, other substance use. Pt denies previous inpatient admission.   Pt presents alert in casual attire with normal speech and eye contact. Pt's mood was pleasant. Pt's affect was congruent. Pt's insight, judgement was fair.   Chief Complaint:  Chief Complaint  Patient presents with   Alcohol Problem   Visit Diagnosis: Alcohol use Disorder, severe.   CCA Screening, Triage and Referral (STR)  Patient Reported Information How did you hear about us ? Family/Friend  What Is the Reason for Your Visit/Call Today? Lynn Cummings is a 68 year old female presenting to [facility] accompanied by her husband. Pt reports she is needing detox. Pt reports she drank alcohol at 1 am this morning. Pt reports she is drinking 3-4 drinks per day. Pt states that she has been drinking for 10-15 years  now. Pt states that she is not currently experiencing withdrawls. Pt mentions that she is not seeing a therapist or taking medication for anxiety/depression. Pt states that she is looking for an inpatient treatment center at this time of triage. Pt has no known mental health diagnoses.  How Long Has This Been Causing You Problems? <Week  What Do You Feel Would Help You the Most Today? Alcohol or Drug Use Treatment   Have You Recently Had Any Thoughts About Hurting Yourself? No  Are You Planning to Commit Suicide/Harm Yourself At This time? No   Flowsheet Row ED from 05/08/2024 in [facility] ED from 10/11/2023 in [facility] Emergency Department at [facility] ED from 07/25/2023 in [facility] Department at [facility]  C-SSRS RISK CATEGORY No Risk No Risk No Risk       Have you Recently Had Thoughts About Hurting Someone Lynn Cummings? No  Are You Planning to Harm Someone at This Time? No  Explanation: NA   Have You Used Any Alcohol or Drugs in the Past 24 Hours? Yes  How Long Ago Did You Use Drugs or Alcohol? Last night.  What Did You Use and How Much? 3-4 glasses of vodka   Do You Currently Have a Therapist/Psychiatrist? No  Name of Therapist/Psychiatrist:    Have You Been Recently Discharged From Any Office Practice or Programs? No  Explanation of Discharge From Practice/Program: NA    CCA Screening Triage Referral Assessment Type of Contact: Face-to-Face  Telemedicine Service Delivery:   Is this Initial or Reassessment?   Date Telepsych consult ordered in [facility]:    Time Telepsych consult ordered in CHL:    Location of Assessment:  [facility] Assessment Services  Provider Location: [facility] Assessment Services   Collateral Involvement: Cameshia Cressman, husband, [phone].   Does Patient Have a Automotive engineer Guardian? No  Legal Guardian Contact Information: Pt is her own guardian.  Copy of Legal Guardianship  Form: -- (Pt is her own guardian.)  Legal Guardian Notified of Arrival: -- (Pt is her own guardian.)  Legal Guardian Notified of Pending Discharge: -- (Pt is her own guardian.)  If Minor and Not Living with Parent(s), Who has Custody? Pt is an adult.  Is CPS involved or ever been involved? Never  Is APS involved or ever been involved? Never   Patient Determined To Be At Risk for Harm To Self or Others Based on Review of Patient Reported Information or Presenting Complaint? No  Method: No Plan  Availability of Means: No access or NA  Intent: Vague intent or NA  Notification Required: No need or identified person  Additional Information for Danger to Others Potential: -- (NA)  Additional Comments for Danger to Others Potential: NA  Are There Guns or Other Weapons in Your Home? No  Types of Guns/Weapons: Pt denies, access to weapons.  Are These Weapons Safely Secured?                            -- (NA)  Who Could Verify You Are Able To Have These Secured: NA  Do You Have any Outstanding Charges, Pending Court Dates, Parole/Probation? Pt denies, legal involvement.  Contacted To Inform of Risk of Harm To Self or Others: Other: Comment (NA)    Does Patient Present under Involuntary Commitment? No    [place] of Residence: [place]   Patient Currently Receiving the Following Services: Not Receiving Services   Determination of Need: Urgent (48 hours)   Options For Referral: Facility-Based Crisis     CCA Biopsychosocial Patient Reported Schizophrenia/Schizoaffective Diagnosis in Past: No   Strengths: Pt has supportive spouse.   Mental Health Symptoms Depression:  Fatigue; Increase/decrease in appetite (Isolation.)   Duration of Depressive symptoms: Duration of Depressive Symptoms: N/A   Mania:  None   Anxiety:   None   Psychosis:  None   Duration of Psychotic symptoms:    Trauma:  None   Obsessions:  None   Compulsions:  None   Inattention:   None   Hyperactivity/Impulsivity:  None   Oppositional/Defiant Behaviors:  None   Emotional Irregularity:  None   Other Mood/Personality Symptoms:  NA    Mental Status Exam Appearance and self-care  Stature:  Average   Weight:  Thin   Clothing:  Casual   Grooming:  Normal   Cosmetic use:  None   Posture/gait:  Normal   Motor activity:  Not Remarkable   Sensorium  Attention:  Normal   Concentration:  Normal   Orientation:  X5   Recall/memory:  Normal   Affect and Mood  Affect:  Congruent   Mood:  Other (Comment) (Pleasant.)   Relating  Eye contact:  Normal   Facial expression:  Responsive   Attitude toward examiner:  Cooperative   Thought and Language  Speech flow: Normal   Thought content:  Appropriate to Mood and Circumstances   Preoccupation:  None   Hallucinations:  None   Organization:  Coherent   [employer] of Knowledge:  Fair   Intelligence:  Average   Abstraction:  Normal   Judgement:  Fair   Dance movement psychotherapist:  Adequate   Insight:  Fair   Decision Making:  Impulsive   Social Functioning  Social Maturity:  Impulsive   Social Judgement:  Normal   Stress  Stressors:  Other (Comment) (Pt reports, she can't think of any stressors.)   Coping Ability:  Normal   Skill Deficits:  None   Supports:  Family     Religion: Religion/Spirituality Are You A Religious Person?: Yes What is Your Religious Affiliation?: Baptist How Might This Affect Treatment?: NA  Leisure/Recreation: Leisure / Recreation Do You Have Hobbies?: No  Exercise/Diet: Exercise/Diet Do You Exercise?: Yes What Type of Exercise Do You Do?: Run/Walk How Many Times a Week Do You Exercise?: Daily Have You Gained or Lost A Significant Amount of Weight in the Past Six Months?: No Do You Follow a Special Diet?:  (Per husband the pt usually doesn't eat when she drink.) Do You Have Any Trouble Sleeping?: No   CCA  Employment/Education Employment/Work Situation: Employment / Work Psychologist, occupational Employment Situation: Retired Passenger transport manager has Been Impacted by Current Illness: No Has Patient ever Been in Equities trader?: No  Education: Education Is Patient Currently Attending School?: No Last Grade Completed: 12 Did You Product manager?: Yes What Type of College Degree Do you Have?: Pt when to college in [place]. Did You Have An Individualized Education Program (IIEP): No Did You Have Any Difficulty At School?: No Patient's Education Has Been Impacted by Current Illness: No   CCA Family/Childhood History Family and Relationship History: Family history Marital status: Married Number of Years Married: 28 What types of issues is patient dealing with in the relationship?: Pt denies. Additional relationship information: NA Does patient have children?: No  Childhood History:  Childhood History By whom was/is the patient raised?: Both parents Did patient suffer any verbal/emotional/physical/sexual abuse as a child?: No Did patient suffer from severe childhood neglect?: No Has patient ever been sexually abused/assaulted/raped as an adolescent or adult?: No Was the patient ever a victim of a crime or a disaster?: No Witnessed domestic violence?: No Has patient been affected by domestic violence as an adult?: No   CCA Substance Use Alcohol/Drug Use: Alcohol / Drug Use Pain Medications: See MAR Prescriptions: See MAR Over the Counter: See MAR History of alcohol / drug use?: Yes Longest period of sobriety (when/how long): UTA Negative Consequences of Use:  (UTA) Withdrawal Symptoms: None Substance #1 Name of Substance 1: Alcohol. 1 - Age of First Use: Pt reports, using for 10-15 years. 1 - Amount (size/oz): Pt reports, had three mixed drinks last night. 1 - Frequency: Daily. 1 - Duration: Ongoing. 1 - Last Use / Amount: 05/07/2024. 1 - Method of Aquiring: Purchase. 1- Route of Use: Oral.     ASAM's:  Six Dimensions of Multidimensional Assessment  Dimension 1:  Acute Intoxication and/or Withdrawal Potential:   Dimension 1:  Description of individual's past and current experiences of substance use and withdrawal: Pt reports, some shaking.  Dimension 2:  Biomedical Conditions and Complications:      Dimension 3:  Emotional, Behavioral, or Cognitive Conditions and Complications:  Dimension 3:  Description of emotional, behavioral, or cognitive conditions and complications: NA  Dimension 4:  Readiness to Change:  Dimension 4:  Description of Readiness to Change criteria: Pt is ready to make change and get help.  Dimension 5:  Relapse, Continued use, or Continued Problem Potential:  Dimension 5:  Relapse, continued use, or continued problem potential critiera description: Pt has ongoing use.  Dimension 6:  Recovery/Living  Environment:  Dimension 6:  Recovery/Iiving environment criteria description: Pt lives with her husband and dogs. Per husband pt walks her dogs 2-3 times daily.  ASAM Severity Score:    ASAM Recommended Level of Treatment:     Substance use Disorder (SUD) Substance Use Disorder (SUD)  Checklist Symptoms of Substance Use: Continued use despite having a persistent/recurrent physical/psychological problem caused/exacerbated by use  Recommendations for Services/Supports/Treatments: Recommendations for Services/Supports/Treatments Recommendations For Services/Supports/Treatments: Facility Based Crisis  Disposition Recommendation per psychiatric provider: Pt to be admitted to Facility Based Crisis.    DSM5 Diagnoses: Patient Active Problem List   Diagnosis Date Noted   Alcohol use disorder 05/08/2024     Referrals to Alternative Service(s): Referred to Alternative Service(s):   Place:   Date:   Time:    Referred to Alternative Service(s):   Place:   Date:   Time:    Referred to Alternative Service(s):   Place:   Date:   Time:    Referred to Alternative  Service(s):   Place:   Date:   Time:     Rosi Converse, [facility] Comprehensive Clinical Assessment (CCA) Screening, Triage and Referral Note  05/08/2024 Tassie Pollett [id number]  Chief Complaint:  Chief Complaint  Patient presents with   Alcohol Problem   Visit Diagnosis:   Patient Reported Information How did you hear about us ? Family/Friend  What Is the Reason for Your Visit/Call Today? Melichar is a 68 year old female presenting to [facility] accompanied by her husband. Pt reports she is needing detox. Pt reports she drank alcohol at 1 am this morning. Pt reports she is drinking 3-4 drinks per day. Pt states that she has been drinking for 10-15 years now. Pt states that she is not currently experiencing withdrawls. Pt mentions that she is not seeing a therapist or taking medication for anxiety/depression. Pt states that she is looking for an inpatient treatment center at this time of triage. Pt has no known mental health diagnoses.  How Long Has This Been Causing You Problems? <Week  What Do You Feel Would Help You the Most Today? Alcohol or Drug Use Treatment   Have You Recently Had Any Thoughts About Hurting Yourself? No  Are You Planning to Commit Suicide/Harm Yourself At This time? No   Have you Recently Had Thoughts About Hurting Someone Lynn Cummings? No  Are You Planning to Harm Someone at This Time? No  Explanation: NA   Have You Used Any Alcohol or Drugs in the Past 24 Hours? Yes  How Long Ago Did You Use Drugs or Alcohol? Last night.  What Did You Use and How Much? 3-4 glasses of vodka   Do You Currently Have a Therapist/Psychiatrist? No  Name of Therapist/Psychiatrist: Pt denies.   Have You Been Recently Discharged From Any Office Practice or Programs? No  Explanation of Discharge From Practice/Program: NA   CCA Screening Triage Referral Assessment Type of Contact: Face-to-Face  Telemedicine Service Delivery:   Is this Initial or Reassessment?   Date  Telepsych consult ordered in CHL:    Time Telepsych consult ordered in CHL:    Location of Assessment: [facility] Assessment Services  Provider Location: [facility] Assessment Services    Collateral Involvement: Amily Depp, husband, [phone].   Does Patient Have a Automotive engineer Guardian? No. Name and Contact of Legal Guardian: Pt is her own guardian. If Minor and Not Living with Parent(s), Who has Custody? Pt is an adult.  Is CPS involved or ever been  involved? Never  Is APS involved or ever been involved? Never   Patient Determined To Be At Risk for Harm To Self or Others Based on Review of Patient Reported Information or Presenting Complaint? No  Method: No Plan  Availability of Means: No access or NA  Intent: Vague intent or NA  Notification Required: No need or identified person  Additional Information for Danger to Others Potential: -- (NA)  Additional Comments for Danger to Others Potential: NA  Are There Guns or Other Weapons in Your Home? No  Types of Guns/Weapons: Pt denies, access to weapons.  Are These Weapons Safely Secured?                            -- (NA)  Who Could Verify You Are Able To Have These Secured: NA  Do You Have any Outstanding Charges, Pending Court Dates, Parole/Probation? Pt denies, legal involvement.  Contacted To Inform of Risk of Harm To Self or Others: Other: Comment (NA)   Does Patient Present under Involuntary Commitment? No    [place] of Residence: [place]   Patient Currently Receiving the Following Services: Not Receiving Services   Determination of Need: Urgent (48 hours)   Options For Referral: Facility-Based Crisis   Disposition Recommendation per psychiatric provider: Pt to be admitted to Facility Based Crisis.   Rosi Converse, LCMHC     Marsela Kuan D Owen Pagnotta, MS, [facility] Triage Specialist [phone]

## 2024-05-09 DIAGNOSIS — F1010 Alcohol abuse, uncomplicated: Secondary | ICD-10-CM | POA: Diagnosis not present

## 2024-05-09 LAB — RPR: RPR Ser Ql: NONREACTIVE

## 2024-05-09 LAB — LIPID PANEL
Cholesterol: 300 mg/dL — ABNORMAL HIGH (ref 0–200)
HDL: >135 mg/dL (ref >40)
Triglycerides: 51 mg/dL (ref <150)
VLDL: 10 mg/dL (ref 0–40)

## 2024-05-09 LAB — TSH: TSH: 5.296 u[IU]/mL — ABNORMAL HIGH (ref 0.350–4.500)

## 2024-05-09 NOTE — ED Notes (Addendum)
 Pt A&Ox 4. Pt denies SI/HI/AVH. Pt oriented to the unit. Pt given meal. Pt denies physical complaints. Pt has a visible tremor. She denies other withdrawal symptoms. CIWA on admission is 7. Support and encouragement provided. Q15 min safety checks in place. Pt encouraged to notify staff if thoughts of hurting themselves or others arise. Pt verbalized understanding and agreement. Pt is currently safe on the unit.

## 2024-05-09 NOTE — Group Note (Signed)
 Group Topic: Relapse and Recovery  Group Date: 05/09/2024 Start Time: 2000 End Time: 2100 Facilitators: Wendall Halls B  Department: [facility]  Number of Participants: 5  Group Focus: abuse issues, acceptance, activities of daily living skills, chemical dependency education, chemical dependency issues, co-dependency, and coping skills Treatment Modality:  Individual Therapy Interventions utilized were leisure development, patient education, and support Purpose: enhance coping skills, express feelings, increase insight, and reinforce self-care  Name: Lynn Cummings Date of Birth: [birth year]  MR: [id number]    Level of Participation: active Quality of Participation: attentive and cooperative Interactions with others: gave feedback Mood/Affect: appropriate Triggers (if applicable): NA Cognition: coherent/clear Progress: Gaining insight Response: NA Plan: patient will be encouraged to keep going to groups.   Patients Problems:  Patient Active Problem List   Diagnosis Date Noted   Alcohol use disorder 05/08/2024   Alcohol abuse 05/08/2024

## 2024-05-09 NOTE — ED Notes (Signed)
 Patient A&Ox4. Denies intent to harm self/others when asked. Denies A/VH. Patient denies any physical complaints when asked. Pt states, "physically, I feel good. I just didn't get much sleep last night. Being in a different environment and the noise from doors closing kept me from sleeping. I'm hoping it will be better tonight". Support and encouragement provided. Informed pt to ask for ear plugs if she encounter difficulty sleeping again tonight and Clinical research associate will alert upcoming staff of pt concerns pertaining to the noise. Pt appreciative. Routine safety checks conducted according to facility protocol. Encouraged patient to notify staff if thoughts of harm toward self or others arise. Patient verbalize understanding and agreement. Will continue to monitor for safety.

## 2024-05-09 NOTE — Progress Notes (Signed)
 Patient resting quietly in bed with eyes closed with unlabored breathing. Q 15 minute safety checks remain in place.  Pt remains safe on the unit at this time.

## 2024-05-09 NOTE — ED Provider Notes (Signed)
 Behavioral Health Progress Note  Date and Time: 05/09/2024 2:27 PM Name: Lynn Cummings MRN:  [id number]  Subjective:   Lynn Cummings is a 68 yr old female who presented on 5/30 to [facility] with her husband requesting help to detox of EtOH, she was admitted to [facility] on 5/30.  PPHx is significant for EtOH Abuse.  She reports that she is feeling ok today.  She reports no withdrawal symptoms.  Discussed possible options after completing Detox.  Discussed both Residential and CD-IOP.  She reports that she is interested in CD-IOP and discussed with her that Social Work would be able to work with her on Monday to discuss this further and she was agreeable.  She reports her sleep was fair last night due to the noise of the unit.  She reports her appetite is good.  She reports no SI, HI, or AVH.  She reports no other concerns at present.  Diagnosis:  Final diagnoses:  Alcohol abuse    Total Time spent with patient:  I personally spent 35 minutes on the unit in direct patient care. The direct patient care time included face-to-face time with the patient, reviewing the patient's chart, communicating with other professionals, and coordinating care. Greater than 50% of this time was spent in counseling or coordinating care with the patient regarding goals of hospitalization, psycho-education, and discharge planning needs.   Past Psychiatric History: EtOH Abuse Past Medical History: Reports None Family History: Reports None Family Psychiatric  History: Reports None Social History: Retired.  Married.  No children.  Additional Social History:                         Sleep: Fair due to noise on unit  Appetite:  Good  Current Medications:  Current Facility-Administered Medications  Medication Dose Route Frequency Provider Last Rate Last Admin   acetaminophen  (TYLENOL ) tablet 650 mg  650 mg Oral Q6H PRN Gilman Lade, NP       alum & mag hydroxide-simeth (MAALOX/MYLANTA)  200-200-20 MG/5ML suspension 30 mL  30 mL Oral Q4H PRN Lorrene Rosser, Veronique M, NP       hydrOXYzine  (ATARAX ) tablet 25 mg  25 mg Oral Q6H PRN Lorrene Rosser, Veronique M, NP       loperamide  (IMODIUM ) capsule 2-4 mg  2-4 mg Oral PRN Byungura, Veronique M, NP       LORazepam  (ATIVAN ) tablet 1 mg  1 mg Oral Q6H PRN Lorrene Rosser, Veronique M, NP       magnesium  hydroxide (MILK OF MAGNESIA) suspension 30 mL  30 mL Oral Daily PRN Byungura, Veronique M, NP       multivitamin with minerals tablet 1 tablet  1 tablet Oral Daily Byungura, Veronique M, NP   1 tablet at 05/09/24 4098   OLANZapine  (ZYPREXA ) injection 5 mg  5 mg Intramuscular TID PRN Byungura, Veronique M, NP       OLANZapine  zydis (ZYPREXA ) disintegrating tablet 5 mg  5 mg Oral TID PRN Byungura, Veronique M, NP       ondansetron  (ZOFRAN -ODT) disintegrating tablet 4 mg  4 mg Oral Q6H PRN Gilman Lade, NP       thiamine  (VITAMIN B1) tablet 100 mg  100 mg Oral Daily Byungura, Veronique M, NP   100 mg at 05/09/24 1191   Current Outpatient Medications  Medication Sig Dispense Refill   pantoprazole (PROTONIX) 40 MG tablet Take 40 mg by mouth daily.     potassium chloride  SA (KLOR-CON  M)  20 MEQ tablet Take 1 tablet (20 mEq total) by mouth 2 (two) times daily. 10 tablet 0    Labs  Lab Results:  Admission on 05/08/2024  Component Date Value Ref Range Status   WBC 05/08/2024 4.7  4.0 - 10.5 K/uL Final   RBC 05/08/2024 4.00  3.87 - 5.11 MIL/uL Final   Hemoglobin 05/08/2024 12.9  12.0 - 15.0 g/dL Final   HCT 82/95/6213 39.2  36.0 - 46.0 % Final   MCV 05/08/2024 98.0  80.0 - 100.0 fL Final   MCH 05/08/2024 32.3  26.0 - 34.0 pg Final   MCHC 05/08/2024 32.9  30.0 - 36.0 g/dL Final   RDW 08/65/7846 14.0  11.5 - 15.5 % Final   Platelets 05/08/2024 183  150 - 400 K/uL Final   nRBC 05/08/2024 0.0  0.0 - 0.2 % Final   Neutrophils Relative % 05/08/2024 60  % Final   Neutro Abs 05/08/2024 2.9  1.7 - 7.7 K/uL Final   Lymphocytes Relative 05/08/2024 30  %  Final   Lymphs Abs 05/08/2024 1.4  0.7 - 4.0 K/uL Final   Monocytes Relative 05/08/2024 8  % Final   Monocytes Absolute 05/08/2024 0.4  0.1 - 1.0 K/uL Final   Eosinophils Relative 05/08/2024 1  % Final   Eosinophils Absolute 05/08/2024 0.1  0.0 - 0.5 K/uL Final   Basophils Relative 05/08/2024 1  % Final   Basophils Absolute 05/08/2024 0.0  0.0 - 0.1 K/uL Final   Immature Granulocytes 05/08/2024 0  % Final   Abs Immature Granulocytes 05/08/2024 0.01  0.00 - 0.07 K/uL Final   Performed at [facility], [address] [place], [place] [address]   Sodium 05/08/2024 144  135 - 145 mmol/L Final   Potassium 05/08/2024 3.5  3.5 - 5.1 mmol/L Final   Chloride 05/08/2024 102  98 - 111 mmol/L Final   CO2 05/08/2024 23  22 - 32 mmol/L Final   Glucose, Bld 05/08/2024 71  70 - 99 mg/dL Final   Glucose reference range applies only to samples taken after fasting for at least 8 hours.   BUN 05/08/2024 10  8 - 23 mg/dL Final   Creatinine, Ser 05/08/2024 0.78  0.44 - 1.00 mg/dL Final   Calcium 28/41/3244 9.6  8.9 - 10.3 mg/dL Final   Total Protein 12/12/7251 7.3  6.5 - 8.1 g/dL Final   Albumin 66/44/0347 4.4  3.5 - 5.0 g/dL Final   AST 42/59/5638 43 (H)  15 - 41 U/L Final   ALT 05/08/2024 20  0 - 44 U/L Final   Alkaline Phosphatase 05/08/2024 105  38 - 126 U/L Final   Total Bilirubin 05/08/2024 0.4  0.0 - 1.2 mg/dL Final   GFR, Estimated 05/08/2024 >60  >60 mL/min Final   Comment: (NOTE) Calculated using the CKD-EPI Creatinine Equation (2021)    Anion gap 05/08/2024 19 (H)  5 - 15 Final   Performed at [facility], [address], [place], [place] [address]   Hgb A1c MFr Bld 05/08/2024 5.0  4.8 - 5.6 % Final   Comment: (NOTE) Diagnosis of Diabetes The following HbA1c ranges recommended by the American Diabetes Association (ADA) may be used as an aid in the diagnosis of diabetes mellitus.  Hemoglobin             Suggested A1C NGSP%              Diagnosis  <5.7  Non  Diabetic  5.7-6.4                Pre-Diabetic  >6.4                   Diabetic  <7.0                   Glycemic control for                       adults with diabetes.     Mean Plasma Glucose 05/08/2024 96.8  mg/dL Final   Performed at [facility], [address], [place], [place] [address]   Magnesium  05/08/2024 1.6 (L)  1.7 - 2.4 mg/dL Final   Performed at [facility], [address] [place], [place] [address]   Alcohol, Ethyl (B) 05/08/2024 78 (H)  <15 mg/dL Final   Comment: (NOTE) For medical purposes only. Performed at [facility], [address] [place], [place] [address]    Cholesterol 05/08/2024 300 (H)  0 - 200 mg/dL Final   Triglycerides 13/07/6577 51  <150 mg/dL Final   HDL 46/96/2952 >135  >40 mg/dL Final   Total CHOL/HDL Ratio 05/08/2024 NOT CALCULATED  RATIO Final   VLDL 05/08/2024 10  0 - 40 mg/dL Final   LDL Cholesterol 05/08/2024 NOT CALCULATED  0 - 99 mg/dL Final   Performed at [facility], [address] [place], [place] [address]   TSH 05/08/2024 5.296 (H)  0.350 - 4.500 uIU/mL Final   Comment: Performed by a 3rd Generation assay with a functional sensitivity of <=0.01 uIU/mL. Performed at [facility], [address], [place], [place] [address]    RPR Ser Ql 05/08/2024 NON REACTIVE  NON REACTIVE Final   Performed at [facility], [address] [place], [place] [address]    Blood Alcohol level:  Lab Results  Component Value Date   ETH 78 (H) 05/08/2024    Metabolic Disorder Labs: Lab Results  Component Value Date   HGBA1C 5.0 05/08/2024   MPG 96.8 05/08/2024   No results found for: "PROLACTIN" Lab Results  Component Value Date   CHOL 300 (H) 05/08/2024   TRIG 51 05/08/2024   HDL >135 05/08/2024   CHOLHDL NOT CALCULATED 05/08/2024   VLDL 10 05/08/2024   LDLCALC NOT CALCULATED 05/08/2024    Therapeutic Lab Levels: No results found for: "LITHIUM" No results found for: "VALPROATE" No results  found for: "CBMZ"  Physical Findings   PHQ2-9    Flowsheet Row ED from 05/08/2024 in [facility] Most recent reading at 05/08/2024 10:02 PM ED from 05/08/2024 in [facility] Most recent reading at 05/08/2024  8:40 PM  PHQ-2 Total Score 2 0  PHQ-9 Total Score 4 --      Flowsheet Row ED from 05/08/2024 in [facility] Most recent reading at 05/08/2024 11:29 PM ED from 05/08/2024 in [facility] Most recent reading at 05/08/2024  6:15 PM ED from 10/11/2023 in [facility] Emergency Department at [facility] Most recent reading at 10/11/2023 12:37 AM  C-SSRS RISK CATEGORY No Risk No Risk No Risk        Musculoskeletal  Strength & Muscle Tone: within normal limits Gait & Station: normal Patient leans: N/A  Psychiatric Specialty Exam  Presentation  General Appearance:  Appropriate for Environment; Casual  Eye  Contact: Good  Speech: Clear and Coherent; Normal Rate  Speech Volume: Normal  Handedness: Right   Mood and Affect  Mood: Dysphoric  Affect: Congruent   Thought Process  Thought Processes: Coherent; Goal Directed  Descriptions of Associations:Intact  Orientation:Full (Time, Place and Person)  Thought Content:Logical; WDL  Diagnosis of Schizophrenia or Schizoaffective disorder in past: No    Hallucinations:Hallucinations: None  Ideas of Reference:None  Suicidal Thoughts:Suicidal Thoughts: No  Homicidal Thoughts:Homicidal Thoughts: No   Sensorium  Memory: Immediate Fair; Recent Fair  Judgment: Fair  Insight: Fair   Art therapist  Concentration: Fair  Attention Span: Fair  Recall: [employer] of Knowledge: Fair  Language: Fair   Psychomotor Activity  Psychomotor Activity: Psychomotor Activity: Normal   Assets  Assets: Communication Skills; Desire for Improvement; Resilience; Social  Support   Sleep  Sleep: Sleep: Fair   Nutritional Assessment (For OBS and FBC admissions only) Has the patient had a weight loss or gain of 10 pounds or more in the last 3 months?: No Has the patient had a decrease in food intake/or appetite?: Yes Does the patient have dental problems?: No Does the patient have eating habits or behaviors that may be indicators of an eating disorder including binging or inducing vomiting?: No Has the patient recently lost weight without trying?: 0 Has the patient been eating poorly because of a decreased appetite?: 1 Malnutrition Screening Tool Score: 1    Physical Exam  Physical Exam Vitals and nursing note reviewed.  Constitutional:      General: She is not in acute distress.    Appearance: Normal appearance. She is normal weight. She is not ill-appearing or toxic-appearing.  HENT:     Head: Normocephalic and atraumatic.  Pulmonary:     Effort: Pulmonary effort is normal.  Musculoskeletal:        General: Normal range of motion.  Neurological:     General: No focal deficit present.     Mental Status: She is alert.    Review of Systems  Respiratory:  Negative for cough and shortness of breath.   Cardiovascular:  Negative for chest pain.  Gastrointestinal:  Negative for abdominal pain, constipation, diarrhea, nausea and vomiting.  Neurological:  Negative for dizziness, weakness and headaches.  Psychiatric/Behavioral:  Negative for depression, hallucinations and suicidal ideas. The patient is not nervous/anxious.    Blood pressure 136/81, pulse 62, temperature 97.7 F (36.5 C), temperature source Oral, resp. rate 17, SpO2 100%. There is no height or weight on file to calculate BMI.  Treatment Plan Summary: Daily contact with patient to assess and evaluate symptoms and progress in treatment and Medication management  Loise Esguerra is a 68 yr old female who presented on 5/30 to [facility] with her husband requesting help to detox of EtOH,  she was admitted to [facility] on 5/30.  PPHx is significant for EtOH Abuse.   Keena is not reporting any withdrawal symptoms at this time.  She is interested in CD-IOP and appreciate Social Works assistance with this on Monday.  We will not make any changes to her medications at this time.  We will continue to monitor.    Withdrawal: -Continue CIWA, last score= 5  @ 0604  5/31 -Continue Ativan  1 mg q6 PRN CIWA>10 -Continue Imodium  2-4 mg PRN diarrhea -Continue Zofran -ODT 4 mg q6 PRN nausea -Continue Thiamine  100 mg daily for nutritional supplementation -Continue Multivitamin daily for nutritional supplementation   -Continue Agitation Protocol: Zyprexa  -Continue PRN's: Tylenol , Maalox, Atarax , Milk of  Magnesia, Trazodone   Zoanne Newill S Joni Colegrove, DO 05/09/2024 2:27 PM

## 2024-05-09 NOTE — ED Notes (Signed)
 Pt keeps shutting her door. This MHT keeps reminding her we have to leave it open for rounding.

## 2024-05-09 NOTE — Group Note (Signed)
 Group Topic: Healthy Self Image and Positive Change  Group Date: 05/09/2024 Start Time: 1000 End Time: 1100 Facilitators: Milan Alfred, NT MHT 2 Department: [facility]  Number of Participants: 4  Group Focus: coping skills Treatment Modality:  Interpersonal Therapy Interventions utilized were reminiscence Purpose: express irrational fears  Name: Lynn Cummings Date of Birth: 1956-06-12  MR: [id number]    Level of Participation: Patient did not attend group Quality of Participation: N/A Interactions with others: N/A Mood/Affect: N/A Triggers (if applicable): N/A Cognition: N/A Progress: N/A Response: N/A Plan: N/A  Patients Problems:  Patient Active Problem List   Diagnosis Date Noted   Alcohol use disorder 05/08/2024   Alcohol abuse 05/08/2024

## 2024-05-09 NOTE — ED Notes (Signed)
 Pt in dayroom watching television. Respirations equal and unlabored, skin warm and dry. Pt was offered support and encouragement. Pt receptive to treatment and safety maintained on unit. Routine safety checks maintained/ongoing.

## 2024-05-09 NOTE — ED Notes (Signed)
 Pt sitting in dayroom watching television and interacting with peers. No acute distress noted. No concerns voiced. Informed pt to notify staff with any needs or assistance. Pt verbalized understanding and agreement. Will continue to monitor for safety.

## 2024-05-09 NOTE — ED Notes (Signed)
Patient is sleeping. Respirations equal and unlabored, skin warm and dry, NAD. No change in assessment or acuity. Routine safety checks conducted according to facility protocol. Will continue to monitor for safety.   

## 2024-05-09 NOTE — ED Notes (Signed)
 Pt sitting in dayroom watching television. No acute distress noted. No concerns voiced. Informed pt to notify staff with any needs or assistance. Pt verbalized understanding and agreement. Will continue to monitor for safety.

## 2024-05-09 NOTE — ED Notes (Signed)
 Pt in bed w/eyes closed resting quietly. Respirations equal and unlabored. No signs or symptoms of distress. Routine safety checks maintained/ongoing.

## 2024-05-10 DIAGNOSIS — F1010 Alcohol abuse, uncomplicated: Secondary | ICD-10-CM | POA: Diagnosis not present

## 2024-05-10 MED ORDER — LORATADINE 10 MG PO TABS
10.0000 mg | ORAL_TABLET | Freq: Every day | ORAL | Status: DC
  Administered 2024-05-10 – 2024-05-13 (×4): 10 mg via ORAL
  Filled 2024-05-10 (×4): qty 1

## 2024-05-10 NOTE — ED Notes (Signed)
 Pt sitting in cafeteria eating dinner and watching television. No acute distress noted. No concerns voiced. Informed pt to notify staff with any needs or assistance. Pt verbalized understanding and agreement. Will continue to monitor for safety.

## 2024-05-10 NOTE — ED Notes (Signed)
 Pt in bed w/eyes closed resting quietly. Respirations equal and unlabored. No signs or symptoms of distress. Routine safety checks maintained/ongoing.

## 2024-05-10 NOTE — ED Notes (Signed)
   05/10/24 0000  CIWA-Ar  BP  (Pt asleep; did not awaken pt for VS d/t c/o inability sleep the previous night. Per MAR; PRN given for anxiety.)  Nausea and Vomiting 0  Tactile Disturbances 0  Tremor 0  Auditory Disturbances 0  Paroxysmal Sweats 0  Visual Disturbances 0  Anxiety 0  Headache, Fullness in Head 0  Agitation 0  Orientation and Clouding of Sensorium 0  CIWA-Ar Total 0

## 2024-05-10 NOTE — ED Provider Notes (Signed)
 Behavioral Health Progress Note  Date and Time: 05/10/2024 1:02 PM Name: Lynn Cummings MRN:  [id number]  Subjective:   Lynn Cummings is a 68 yr old female who presented on 5/30 to [facility] with her husband requesting help to detox of EtOH, she was admitted to [facility] on 5/30.  PPHx is significant for EtOH Abuse.  She reports she is doing well today.  She reports no withdrawal symptoms.  She reports no cravings.  When asked if she has had any symptoms of depression or anxiety she reports none.  She reports she is still interested in CD-IOP.  She reports she is having some seasonal allergies and that she normally takes the [employer] brand OTC pills.  Discussed we would order the equivalent and she was agreeable.  She reports no SI, HI, or AVH.  She reports her sleep was good last night.  She reports her appetite is good.  She reports no other concerns at present.  Diagnosis:  Final diagnoses:  Alcohol abuse    Total Time spent with patient:  I personally spent 35 minutes on the unit in direct patient care. The direct patient care time included face-to-face time with the patient, reviewing the patient's chart, communicating with other professionals, and coordinating care. Greater than 50% of this time was spent in counseling or coordinating care with the patient regarding goals of hospitalization, psycho-education, and discharge planning needs.   Past Psychiatric History: EtOH Abuse Past Medical History: Reports None Family History: Reports None Family Psychiatric  History: Reports None Social History: Retired.  Married.  No children.  Additional Social History:                         Sleep: Good   Appetite:  Good  Current Medications:  Current Facility-Administered Medications  Medication Dose Route Frequency Provider Last Rate Last Admin   acetaminophen  (TYLENOL ) tablet 650 mg  650 mg Oral Q6H PRN Gilman Lade, NP       alum & mag hydroxide-simeth  (MAALOX/MYLANTA) 200-200-20 MG/5ML suspension 30 mL  30 mL Oral Q4H PRN Byungura, Veronique M, NP       hydrOXYzine  (ATARAX ) tablet 25 mg  25 mg Oral Q6H PRN Lorrene Rosser, Veronique M, NP   25 mg at 05/09/24 2105   loperamide  (IMODIUM ) capsule 2-4 mg  2-4 mg Oral PRN Byungura, Veronique M, NP       loratadine (CLARITIN) tablet 10 mg  10 mg Oral Daily Fatisha Rabalais S, DO       LORazepam  (ATIVAN ) tablet 1 mg  1 mg Oral Q6H PRN Lorrene Rosser, Veronique M, NP       magnesium  hydroxide (MILK OF MAGNESIA) suspension 30 mL  30 mL Oral Daily PRN Byungura, Veronique M, NP       multivitamin with minerals tablet 1 tablet  1 tablet Oral Daily Byungura, Veronique M, NP   1 tablet at 05/10/24 8657   OLANZapine  (ZYPREXA ) injection 5 mg  5 mg Intramuscular TID PRN Byungura, Veronique M, NP       OLANZapine  zydis (ZYPREXA ) disintegrating tablet 5 mg  5 mg Oral TID PRN Gilman Lade, NP       ondansetron  (ZOFRAN -ODT) disintegrating tablet 4 mg  4 mg Oral Q6H PRN Gilman Lade, NP       thiamine  (VITAMIN B1) tablet 100 mg  100 mg Oral Daily Byungura, Veronique M, NP   100 mg at 05/10/24 8469   Current Outpatient Medications  Medication  Sig Dispense Refill   pantoprazole (PROTONIX) 40 MG tablet Take 40 mg by mouth daily.     potassium chloride  SA (KLOR-CON  M) 20 MEQ tablet Take 1 tablet (20 mEq total) by mouth 2 (two) times daily. 10 tablet 0    Labs  Lab Results:  Admission on 05/08/2024  Component Date Value Ref Range Status   WBC 05/08/2024 4.7  4.0 - 10.5 K/uL Final   RBC 05/08/2024 4.00  3.87 - 5.11 MIL/uL Final   Hemoglobin 05/08/2024 12.9  12.0 - 15.0 g/dL Final   HCT 44/02/4741 39.2  36.0 - 46.0 % Final   MCV 05/08/2024 98.0  80.0 - 100.0 fL Final   MCH 05/08/2024 32.3  26.0 - 34.0 pg Final   MCHC 05/08/2024 32.9  30.0 - 36.0 g/dL Final   RDW 59/56/3875 14.0  11.5 - 15.5 % Final   Platelets 05/08/2024 183  150 - 400 K/uL Final   nRBC 05/08/2024 0.0  0.0 - 0.2 % Final   Neutrophils  Relative % 05/08/2024 60  % Final   Neutro Abs 05/08/2024 2.9  1.7 - 7.7 K/uL Final   Lymphocytes Relative 05/08/2024 30  % Final   Lymphs Abs 05/08/2024 1.4  0.7 - 4.0 K/uL Final   Monocytes Relative 05/08/2024 8  % Final   Monocytes Absolute 05/08/2024 0.4  0.1 - 1.0 K/uL Final   Eosinophils Relative 05/08/2024 1  % Final   Eosinophils Absolute 05/08/2024 0.1  0.0 - 0.5 K/uL Final   Basophils Relative 05/08/2024 1  % Final   Basophils Absolute 05/08/2024 0.0  0.0 - 0.1 K/uL Final   Immature Granulocytes 05/08/2024 0  % Final   Abs Immature Granulocytes 05/08/2024 0.01  0.00 - 0.07 K/uL Final   Performed at [facility], [address] [place], [place] [address]   Sodium 05/08/2024 144  135 - 145 mmol/L Final   Potassium 05/08/2024 3.5  3.5 - 5.1 mmol/L Final   Chloride 05/08/2024 102  98 - 111 mmol/L Final   CO2 05/08/2024 23  22 - 32 mmol/L Final   Glucose, Bld 05/08/2024 71  70 - 99 mg/dL Final   Glucose reference range applies only to samples taken after fasting for at least 8 hours.   BUN 05/08/2024 10  8 - 23 mg/dL Final   Creatinine, Ser 05/08/2024 0.78  0.44 - 1.00 mg/dL Final   Calcium 95/18/8416 9.6  8.9 - 10.3 mg/dL Final   Total Protein 60/63/0160 7.3  6.5 - 8.1 g/dL Final   Albumin 10/93/2355 4.4  3.5 - 5.0 g/dL Final   AST 73/22/0254 43 (H)  15 - 41 U/L Final   ALT 05/08/2024 20  0 - 44 U/L Final   Alkaline Phosphatase 05/08/2024 105  38 - 126 U/L Final   Total Bilirubin 05/08/2024 0.4  0.0 - 1.2 mg/dL Final   GFR, Estimated 05/08/2024 >60  >60 mL/min Final   Comment: (NOTE) Calculated using the CKD-EPI Creatinine Equation (2021)    Anion gap 05/08/2024 19 (H)  5 - 15 Final   Performed at [facility], [address] [place], [place] [address]   Hgb A1c MFr Bld 05/08/2024 5.0  4.8 - 5.6 % Final   Comment: (NOTE) Diagnosis of Diabetes The following HbA1c ranges recommended by the American Diabetes Association (ADA) may be used as an aid in the  diagnosis of diabetes mellitus.  Hemoglobin  Suggested A1C NGSP%              Diagnosis  <5.7                   Non Diabetic  5.7-6.4                Pre-Diabetic  >6.4                   Diabetic  <7.0                   Glycemic control for                       adults with diabetes.     Mean Plasma Glucose 05/08/2024 96.8  mg/dL Final   Performed at [facility], [address], [place], [place] [address]   Magnesium  05/08/2024 1.6 (L)  1.7 - 2.4 mg/dL Final   Performed at [facility], [address], [place], [place] [address]   Alcohol, Ethyl (B) 05/08/2024 78 (H)  <15 mg/dL Final   Comment: (NOTE) For medical purposes only. Performed at [facility], [address] [place], [place] [address]    Cholesterol 05/08/2024 300 (H)  0 - 200 mg/dL Final   Triglycerides 13/24/4010 51  <150 mg/dL Final   HDL 27/25/3664 >135  >40 mg/dL Final   Total CHOL/HDL Ratio 05/08/2024 NOT CALCULATED  RATIO Final   VLDL 05/08/2024 10  0 - 40 mg/dL Final   LDL Cholesterol 05/08/2024 NOT CALCULATED  0 - 99 mg/dL Final   Performed at [facility], [address] [place], [place] [address]   TSH 05/08/2024 5.296 (H)  0.350 - 4.500 uIU/mL Final   Comment: Performed by a 3rd Generation assay with a functional sensitivity of <=0.01 uIU/mL. Performed at [facility], [address], [place], [place] [address]    RPR Ser Ql 05/08/2024 NON REACTIVE  NON REACTIVE Final   Performed at [facility], [address] [place], [place] [address]    Blood Alcohol level:  Lab Results  Component Value Date   ETH 78 (H) 05/08/2024    Metabolic Disorder Labs: Lab Results  Component Value Date   HGBA1C 5.0 05/08/2024   MPG 96.8 05/08/2024   No results found for: "PROLACTIN" Lab Results  Component Value Date   CHOL 300 (H) 05/08/2024   TRIG 51 05/08/2024   HDL >135 05/08/2024   CHOLHDL NOT CALCULATED 05/08/2024   VLDL 10 05/08/2024   LDLCALC NOT  CALCULATED 05/08/2024    Therapeutic Lab Levels: No results found for: "LITHIUM" No results found for: "VALPROATE" No results found for: "CBMZ"  Physical Findings   PHQ2-9    Flowsheet Row ED from 05/08/2024 in [facility] Most recent reading at 05/08/2024 10:02 PM ED from 05/08/2024 in [facility] Most recent reading at 05/08/2024  8:40 PM  PHQ-2 Total Score 2 0  PHQ-9 Total Score 4 --      Flowsheet Row ED from 05/08/2024 in [facility] Most recent reading at 05/09/2024  9:02 PM ED from 05/08/2024 in [facility] Most recent reading at 05/08/2024  6:15 PM ED from 10/11/2023 in [facility] Emergency Department at [facility] Most recent reading at 10/11/2023 12:37 AM  C-SSRS RISK CATEGORY No Risk No Risk No Risk  Musculoskeletal  Strength & Muscle Tone: within normal limits Gait & Station: normal Patient leans: N/A  Psychiatric Specialty Exam  Presentation  General Appearance:  Appropriate for Environment; Casual  Eye Contact: Good  Speech: Clear and Coherent; Normal Rate  Speech Volume: Normal  Handedness: Right   Mood and Affect  Mood: -- ("ok")  Affect: Congruent; Appropriate   Thought Process  Thought Processes: Coherent; Goal Directed  Descriptions of Associations:Intact  Orientation:Full (Time, Place and Person)  Thought Content:Logical; WDL  Diagnosis of Schizophrenia or Schizoaffective disorder in past: No    Hallucinations:Hallucinations: None  Ideas of Reference:None  Suicidal Thoughts:Suicidal Thoughts: No  Homicidal Thoughts:Homicidal Thoughts: No   Sensorium  Memory: Immediate Fair; Recent Fair  Judgment: Fair  Insight: Fair   Art therapist  Concentration: Fair  Attention Span: Fair  Recall: [employer] of Knowledge: Fair  Language: Fair   Psychomotor Activity  Psychomotor  Activity: Psychomotor Activity: Normal   Assets  Assets: Communication Skills; Desire for Improvement; Resilience; Social Support   Sleep  Sleep: Sleep: Good   No data recorded   Physical Exam  Physical Exam Vitals and nursing note reviewed.  Constitutional:      General: She is not in acute distress.    Appearance: Normal appearance. She is normal weight. She is not ill-appearing or toxic-appearing.  Pulmonary:     Effort: Pulmonary effort is normal.  Musculoskeletal:        General: Normal range of motion.  Neurological:     General: No focal deficit present.     Mental Status: She is alert.    Review of Systems  Respiratory:  Negative for cough and shortness of breath.   Cardiovascular:  Negative for chest pain.  Gastrointestinal:  Negative for abdominal pain, constipation, diarrhea, nausea and vomiting.  Neurological:  Negative for dizziness, weakness and headaches.  Psychiatric/Behavioral:  Negative for depression, hallucinations and suicidal ideas. The patient is not nervous/anxious.    Blood pressure 122/82, pulse 61, temperature 97.9 F (36.6 C), temperature source Oral, resp. rate 16, SpO2 100%. There is no height or weight on file to calculate BMI.  Treatment Plan Summary: Daily contact with patient to assess and evaluate symptoms and progress in treatment and Medication management  Lynn Cummings is a 68 yr old female who presented on 5/30 to [facility] with her husband requesting help to detox of EtOH, she was admitted to [facility] on 5/30.  PPHx is significant for EtOH Abuse.   Lynn Cummings is tolerating detox well.  She is not reporting any symptoms of depression or anxiety so will not start any medications at this time.  We will start Loratadine for her seasonal allergies.  She continues to be interested in CD-IOP and appreciate [employer] assistance with this.  We will not make any other changes to her medications at this time.  We will continue to monitor.     Withdrawal: -Continue CIWA, last score= 0  @ 0600  6/1 -Continue Ativan  1 mg q6 PRN CIWA>10 -Continue Imodium  2-4 mg PRN diarrhea -Continue Zofran -ODT 4 mg q6 PRN nausea -Continue Thiamine  100 mg daily for nutritional supplementation -Continue Multivitamin daily for nutritional supplementation   Seasonal Allergies: -Start Loratadine 10 mg daily   -Continue Agitation Protocol: Zyprexa  -Continue PRN's: Tylenol , Maalox, Atarax , Milk of Magnesia, Trazodone   Lynn Bosworth, DO 05/10/2024 1:02 PM

## 2024-05-10 NOTE — ED Notes (Signed)
 Pt sitting in dayroom watching television and interacting with peers. No acute distress noted. No concerns voiced. Informed pt to notify staff with any needs or assistance. Pt verbalized understanding and agreement. Will continue to monitor for safety.

## 2024-05-10 NOTE — Group Note (Signed)
 Group Topic: Communication  Group Date: 05/10/2024 Start Time: 0900 End Time: 1030 Facilitators: Denzil Flatten, RN  Department: [facility]  Number of Participants: 8  Group Focus: communication Treatment Modality:  Leisure Development Interventions utilized were patient education Purpose: increase insight  Name: Lynn Cummings Date of Birth: 1956-01-27  MR: [id number]    Level of Participation: active Quality of Participation: attentive Interactions with others: gave feedback Mood/Affect: appropriate Triggers (if applicable): none identified Cognition: coherent/clear Progress: Gaining insight Response: verbalized understanding of medications given and SE to report to staff  Plan: patient will be encouraged to notify staff with any SE from medications administered  Patients Problems:  Patient Active Problem List   Diagnosis Date Noted   Alcohol use disorder 05/08/2024   Alcohol abuse 05/08/2024

## 2024-05-10 NOTE — Group Note (Signed)
 Group Topic: Relapse and Recovery  Group Date: 05/10/2024 Start Time: 2100 End Time: 2130 Facilitators: Valeda Corzine , Lucian Rust, NT  Department: [facility]  Number of Participants: 9  Group Focus: goals/reality orientation, relapse prevention, and self-awareness Treatment Modality:  Cognitive Behavioral Therapy and Solution-Focused Therapy Interventions utilized were patient education, problem solving, and support Purpose: enhance coping skills, explore maladaptive thinking, express feelings, and relapse prevention strategies  Name: Lynn Cummings Date of Birth: 1956/02/04  MR: [id number]    Level of Participation: moderate Quality of Participation: attentive Interactions with others: gave feedback Mood/Affect: anxious and appropriate Triggers (if applicable): N/A Cognition: coherent/clear, goal directed, and insightful Progress: Moderate Response: Sometimes I am not aware of how much I drink because I do it so passively, I think I need to become more aware. Also find something else to do. Plan: referral / recommendations  Patients Problems:  Patient Active Problem List   Diagnosis Date Noted   Alcohol use disorder 05/08/2024   Alcohol abuse 05/08/2024

## 2024-05-10 NOTE — ED Notes (Signed)
 Patient A&Ox4. Denies intent to harm self/others when asked. Denies A/VH. Patient denies any physical complaints when asked. No acute distress noted. Pt states, "I feel pretty good today". Support and encouragement provided. Routine safety checks conducted according to facility protocol. Encouraged patient to notify staff if thoughts of harm toward self or others arise. Patient verbalize understanding and agreement. Will continue to monitor for safety.

## 2024-05-10 NOTE — ED Notes (Signed)
 Patient A/Ox4, in the dayroom watching TV with other patients. Pleasant and calm, NAD. Respirations even and unlabored. Environment secured per policy. Will monitor for safety.

## 2024-05-11 DIAGNOSIS — F1010 Alcohol abuse, uncomplicated: Secondary | ICD-10-CM | POA: Diagnosis not present

## 2024-05-11 MED ORDER — HYDROXYZINE HCL 25 MG PO TABS
25.0000 mg | ORAL_TABLET | ORAL | Status: AC
  Administered 2024-05-11: 25 mg via ORAL
  Filled 2024-05-11: qty 1

## 2024-05-11 NOTE — Group Note (Signed)
 Group Topic: Understanding Self  Group Date: 05/11/2024 Start Time: 1710 End Time: 1725 Facilitators: Pennie Box, RN  Department: [facility]  Number of Participants: 8  Group Focus: self-awareness Treatment Modality:  Psychoeducation Interventions utilized were exploration, group exercise, and patient education Purpose: increase insight  Name: Lynn Cummings Date of Birth: 31-Jan-1956  MR: [id number]    Level of Participation: minimal Quality of Participation: cooperative, attentive Interactions with others: gave feedback Mood/Affect: appropriate Triggers (if applicable): none Cognition: coherent/clear Progress: Significant Response: pt identified 'family' and 'communication' as values that are important to her  Plan: patient will be encouraged to attend future RN groups   Patients Problems:  Patient Active Problem List   Diagnosis Date Noted   Alcohol use disorder 05/08/2024   Alcohol abuse 05/08/2024

## 2024-05-11 NOTE — Group Note (Signed)
 Group Topic: Healthy Self Image and Positive Change  Group Date: 05/11/2024 Start Time: 2030 End Time: 2100 Facilitators: Wendall Halls B  Department: [facility]  Number of Participants: 6  Group Focus: chemical dependency education, communication, community group, coping skills, daily focus, nursing group, personal responsibility, problem solving, relapse prevention, relaxation, and substance abuse education Treatment Modality:  Leisure Development Interventions utilized were group exercise, patient education, and support Purpose: enhance coping skills  Name: Lynn Cummings Date of Birth: 09-03-1956  MR: [id number]    Level of Participation: active Quality of Participation: attentive, cooperative, and initiates communication Interactions with others: gave feedback Mood/Affect: appropriate and brightens with interaction Triggers (if applicable): NA Cognition: coherent/clear Progress: Gaining insight Response: NA Plan: patient will be encouraged to keep going to groups.   Patients Problems:  Patient Active Problem List   Diagnosis Date Noted   Alcohol use disorder 05/08/2024   Alcohol abuse 05/08/2024

## 2024-05-11 NOTE — ED Notes (Signed)
 Patient is sleeping. Respirations equal and unlabored, skin warm and dry. No change in assessment or acuity. Routine safety checks conducted according to facility protocol. Will continue to monitor for safety.

## 2024-05-11 NOTE — ED Notes (Signed)
 Pt has been OOB in dayroom watching TV and interacting with peers. Calm and pleasant/ Q 15 minute observations for safety continue

## 2024-05-11 NOTE — Group Note (Signed)
 Group Topic: Relaxation  Group Date: 05/11/2024 Start Time: 1300 End Time: 1330 Facilitators: Chipper Council, NT  Department: [facility]  Number of Participants: 5  Group Focus: check in and leisure skills Treatment Modality:  Psychoeducation Interventions utilized were leisure development Purpose: increase insight  Name: Lynn Cummings Date of Birth: 04/11/1956  MR: [id number]    Level of Participation: active Quality of Participation: attentive Interactions with others: gave feedback Mood/Affect: appropriate Triggers (if applicable): n/a Cognition: coherent/clear Progress: Gaining insight Response: n/a Plan: patient will be encouraged to attend future groups.  Patients Problems:  Patient Active Problem List   Diagnosis Date Noted   Alcohol use disorder 05/08/2024   Alcohol abuse 05/08/2024

## 2024-05-11 NOTE — ED Notes (Signed)
 Pt was provided dinner.

## 2024-05-11 NOTE — ED Notes (Signed)
 Pt presents sitting in day room completing cross word puzzle.  Calm and cooperative. Reports sleeping well last night.   Pt report she is wanting outpatient treatment upon discharge, Denied current SI plan and intent,  Denied HI and A/V hallucinations

## 2024-05-11 NOTE — ED Provider Notes (Signed)
 Behavioral Health Progress Note  Date and Time: 05/11/2024 2:35 PM Name: Lynn Cummings MRN:  [id number]  Subjective:  Lynn Cummings is a 68 year-old female who presents to [facility] voluntarily, accompanied by her husband Jessyka Austria [phone], with chief complaint of alcohol abuse. She reports that this is the first time she seeks help with her alcohol problem. Patient reports she has never had any treatment for her alcohol problem. Reports that she has been drinking  for many years but for the last couple of days, the use has increased more and more and safety is becoming an issue.    Patient allows spouse to provide information regarding her drinking problem. Spouse reports that they have been married for over 30 years. Spouse does not use alcohol.  Patient has used alcohol for many years "we thought it was just the young age, and I stayed non-judgemental, because I love and care about her.  For the past couple of years, she has been drinking  and falling down a lot. Yesterday, I found her sitting on the toilet, all wet, and looked sad, and she did not look herself. She was confused and could hardly talk. She did not eat, was just drinking. She usually drinks Vodka, every day".  Patient reports this is the first time she seeks help with her alcohol problem "because its becoming dangerous". Admits to experiencing multiple falls resulting in injuries when intoxicated and currently under medical treatment for left eye injury. Admits to drinking vodka every day. Denies using beer or wine. Reports no trigger. Reports no hx of depression or anxiety.   Patient is retired and stays home with their 2 dogs. She buys alcohol at the liquor store, and drinks throughout the day when spouse is at work. She denies hx of addiction in her family of origin. Patient and spouse have been married for over 30 years and have no children. Parents and siblings are deceased. Spouse is supportive and encouraging.    Diagnosis:  Final diagnoses:  Alcohol abuse   Patient reports feeling good overall. She's talking with husband daily. At this time she is open to IOP as opposed to residential treatment. Discussed low Mg and avoiding co-administration with Ca++ supplement.  Total Time spent with patient: 15 minutes   Sleep: Fair  Appetite:  Good  Current Medications:  Current Facility-Administered Medications  Medication Dose Route Frequency Provider Last Rate Last Admin   acetaminophen  (TYLENOL ) tablet 650 mg  650 mg Oral Q6H PRN Gilman Lade, NP       alum & mag hydroxide-simeth (MAALOX/MYLANTA) 200-200-20 MG/5ML suspension 30 mL  30 mL Oral Q4H PRN Lorrene Rosser, Veronique M, NP       hydrOXYzine  (ATARAX ) tablet 25 mg  25 mg Oral Q6H PRN Lorrene Rosser, Veronique M, NP   25 mg at 05/10/24 2101   loperamide  (IMODIUM ) capsule 2-4 mg  2-4 mg Oral PRN Byungura, Veronique M, NP       loratadine (CLARITIN) tablet 10 mg  10 mg Oral Daily Pashayan, Alexander S, DO   10 mg at 05/11/24 0865   LORazepam  (ATIVAN ) tablet 1 mg  1 mg Oral Q6H PRN Gilman Lade, NP       magnesium  hydroxide (MILK OF MAGNESIA) suspension 30 mL  30 mL Oral Daily PRN Byungura, Veronique M, NP       multivitamin with minerals tablet 1 tablet  1 tablet Oral Daily Gilman Lade, NP   1 tablet at 05/11/24 7846   OLANZapine  (ZYPREXA )  injection 5 mg  5 mg Intramuscular TID PRN Byungura, Veronique M, NP       OLANZapine  zydis (ZYPREXA ) disintegrating tablet 5 mg  5 mg Oral TID PRN Byungura, Veronique M, NP       ondansetron  (ZOFRAN -ODT) disintegrating tablet 4 mg  4 mg Oral Q6H PRN Lorrene Rosser, Veronique M, NP       thiamine  (VITAMIN B1) tablet 100 mg  100 mg Oral Daily Byungura, Veronique M, NP   100 mg at 05/11/24 1610   Current Outpatient Medications  Medication Sig Dispense Refill   pantoprazole (PROTONIX) 40 MG tablet Take 40 mg by mouth daily.     potassium chloride  SA (KLOR-CON  M) 20 MEQ tablet Take 1 tablet (20 mEq  total) by mouth 2 (two) times daily. 10 tablet 0    Labs  Lab Results:  Admission on 05/08/2024  Component Date Value Ref Range Status   WBC 05/08/2024 4.7  4.0 - 10.5 K/uL Final   RBC 05/08/2024 4.00  3.87 - 5.11 MIL/uL Final   Hemoglobin 05/08/2024 12.9  12.0 - 15.0 g/dL Final   HCT 96/03/5408 39.2  36.0 - 46.0 % Final   MCV 05/08/2024 98.0  80.0 - 100.0 fL Final   MCH 05/08/2024 32.3  26.0 - 34.0 pg Final   MCHC 05/08/2024 32.9  30.0 - 36.0 g/dL Final   RDW 81/19/1478 14.0  11.5 - 15.5 % Final   Platelets 05/08/2024 183  150 - 400 K/uL Final   nRBC 05/08/2024 0.0  0.0 - 0.2 % Final   Neutrophils Relative % 05/08/2024 60  % Final   Neutro Abs 05/08/2024 2.9  1.7 - 7.7 K/uL Final   Lymphocytes Relative 05/08/2024 30  % Final   Lymphs Abs 05/08/2024 1.4  0.7 - 4.0 K/uL Final   Monocytes Relative 05/08/2024 8  % Final   Monocytes Absolute 05/08/2024 0.4  0.1 - 1.0 K/uL Final   Eosinophils Relative 05/08/2024 1  % Final   Eosinophils Absolute 05/08/2024 0.1  0.0 - 0.5 K/uL Final   Basophils Relative 05/08/2024 1  % Final   Basophils Absolute 05/08/2024 0.0  0.0 - 0.1 K/uL Final   Immature Granulocytes 05/08/2024 0  % Final   Abs Immature Granulocytes 05/08/2024 0.01  0.00 - 0.07 K/uL Final   Performed at [facility], [address] [place], [place] [address]   Sodium 05/08/2024 144  135 - 145 mmol/L Final   Potassium 05/08/2024 3.5  3.5 - 5.1 mmol/L Final   Chloride 05/08/2024 102  98 - 111 mmol/L Final   CO2 05/08/2024 23  22 - 32 mmol/L Final   Glucose, Bld 05/08/2024 71  70 - 99 mg/dL Final   Glucose reference range applies only to samples taken after fasting for at least 8 hours.   BUN 05/08/2024 10  8 - 23 mg/dL Final   Creatinine, Ser 05/08/2024 0.78  0.44 - 1.00 mg/dL Final   Calcium 13/07/6577 9.6  8.9 - 10.3 mg/dL Final   Total Protein 46/96/2952 7.3  6.5 - 8.1 g/dL Final   Albumin 84/13/2440 4.4  3.5 - 5.0 g/dL Final   AST 10/06/2535 43 (H)  15 - 41 U/L Final    ALT 05/08/2024 20  0 - 44 U/L Final   Alkaline Phosphatase 05/08/2024 105  38 - 126 U/L Final   Total Bilirubin 05/08/2024 0.4  0.0 - 1.2 mg/dL Final   GFR, Estimated 05/08/2024 >60  >60 mL/min Final   Comment: (NOTE)  Calculated using the CKD-EPI Creatinine Equation (2021)    Anion gap 05/08/2024 19 (H)  5 - 15 Final   Performed at [facility], [address] [place], [place] [address]   Hgb A1c MFr Bld 05/08/2024 5.0  4.8 - 5.6 % Final   Comment: (NOTE) Diagnosis of Diabetes The following HbA1c ranges recommended by the American Diabetes Association (ADA) may be used as an aid in the diagnosis of diabetes mellitus.  Hemoglobin             Suggested A1C NGSP%              Diagnosis  <5.7                   Non Diabetic  5.7-6.4                Pre-Diabetic  >6.4                   Diabetic  <7.0                   Glycemic control for                       adults with diabetes.     Mean Plasma Glucose 05/08/2024 96.8  mg/dL Final   Performed at [facility], [address] [place], [place] [address]   Magnesium  05/08/2024 1.6 (L)  1.7 - 2.4 mg/dL Final   Performed at [facility], [address], [place], [place] [address]   Alcohol, Ethyl (B) 05/08/2024 78 (H)  <15 mg/dL Final   Comment: (NOTE) For medical purposes only. Performed at [facility], [address] [place], [place] [address]    Cholesterol 05/08/2024 300 (H)  0 - 200 mg/dL Final   Triglycerides 27/25/3664 51  <150 mg/dL Final   HDL 40/34/7425 >135  >40 mg/dL Final   Total CHOL/HDL Ratio 05/08/2024 NOT CALCULATED  RATIO Final   VLDL 05/08/2024 10  0 - 40 mg/dL Final   LDL Cholesterol 05/08/2024 NOT CALCULATED  0 - 99 mg/dL Final   Performed at [facility], [address] [place], [place] [address]   TSH 05/08/2024 5.296 (H)  0.350 - 4.500 uIU/mL Final   Comment: Performed by a 3rd Generation assay with a functional sensitivity of <=0.01 uIU/mL. Performed at [facility], [address], [place], [place] [address]    RPR Ser Ql 05/08/2024 NON REACTIVE  NON REACTIVE Final   Performed at [facility], [address], [place], [place] [address]    Blood Alcohol level:  Lab Results  Component Value Date   ETH 78 (H) 05/08/2024    Metabolic Disorder Labs: Lab Results  Component Value Date   HGBA1C 5.0 05/08/2024   MPG 96.8 05/08/2024   No results found for: "PROLACTIN" Lab Results  Component Value Date   CHOL 300 (H) 05/08/2024   TRIG 51 05/08/2024   HDL >135 05/08/2024   CHOLHDL NOT CALCULATED 05/08/2024   VLDL 10 05/08/2024   LDLCALC NOT CALCULATED 05/08/2024    Therapeutic Lab Levels: No results found for: "LITHIUM" No results found for: "VALPROATE" No results found for: "CBMZ"  Physical Findings   PHQ2-9    Flowsheet Row ED from 05/08/2024 in [facility] Most recent reading at 05/08/2024 10:02 PM ED from 05/08/2024 in [facility] Most recent reading at 05/08/2024  8:40 PM  PHQ-2 Total Score 2 0  PHQ-9 Total Score 4 --      Flowsheet Row ED from 05/08/2024 in [facility] Most recent reading at 05/09/2024  9:02 PM ED from 05/08/2024 in [facility] Most recent reading at 05/08/2024  6:15 PM ED from 10/11/2023 in [facility] Emergency Department at [facility] Most recent reading at 10/11/2023 12:37 AM  C-SSRS RISK CATEGORY No Risk No Risk No Risk        Psychiatric Specialty Exam  Presentation  General Appearance:  Appropriate for Environment; Casual  Eye Contact: Good  Speech: Clear and Coherent; Normal Rate  Speech Volume: Normal  Mood and Affect  Mood: "alright"  Affect: Congruent; Appropriate  Thought Process  Thought Processes: Coherent; Goal Directed  Descriptions of Associations:Intact  Orientation:Full (Time, Place and Person)  Thought Content:Logical; WDL  Diagnosis of  Schizophrenia or Schizoaffective disorder in past: No    Hallucinations:Hallucinations: None  Ideas of Reference:None  Suicidal Thoughts:Suicidal Thoughts: No  Homicidal Thoughts:Homicidal Thoughts: No  Sensorium  Memory: Immediate Fair; Recent Fair  Judgment: Fair  Insight: Fair  Art therapist  Concentration: Fair  Attention Span: Fair  Recall: [employer] of Knowledge: Fair  Language: Fair  Psychomotor Activity  Psychomotor Activity: Psychomotor Activity: Normal  Assets  Assets: Communication Skills; Desire for Improvement; Resilience; Social Support   Sleep  Sleep: Sleep: Good  Physical Exam  Physical Exam ROS Blood pressure 115/64, pulse (!) 57, temperature 97.7 F (36.5 C), temperature source Oral, resp. rate 20, SpO2 99%. There is no height or weight on file to calculate BMI.  Treatment Plan Summary: Daily contact with patient to assess and evaluate symptoms and progress in treatment. Patient reports feeling good overall. She's talking with husband daily. At this time she is open to IOP as opposed to residential treatment. Anticipate another day to monitor and discuss options with discharge to short-term residential program or home/IOP tomorrow.  Nicklas Barns, MD 05/11/2024 2:35 PM

## 2024-05-11 NOTE — ED Notes (Signed)
 Patient is in bed calm and sleeping. NAD. Will keep monitoring for safety.

## 2024-05-11 NOTE — ED Notes (Signed)
 Pt ate dinner and participated in RN psycho education group. Pt currently drinking, watching TV in day room with other pts. No complaints or concerns reported to RN. No distress observed.

## 2024-05-11 NOTE — ED Notes (Signed)
 Pt is in the dayroom watching TV with peers. Pt denies SI/HI/AVH. Pt has no further complain.No acute distress noted. Will continue to monitor for safety and provide support.

## 2024-05-11 NOTE — ED Notes (Signed)
 Pt was provided lunch

## 2024-05-11 NOTE — ED Notes (Signed)
 Patient asleep, NAD. Will keep monitoring for safety.

## 2024-05-12 DIAGNOSIS — F1010 Alcohol abuse, uncomplicated: Secondary | ICD-10-CM | POA: Diagnosis not present

## 2024-05-12 MED ORDER — PANTOPRAZOLE SODIUM 20 MG PO TBEC
20.0000 mg | DELAYED_RELEASE_TABLET | Freq: Every day | ORAL | Status: DC
  Administered 2024-05-12 – 2024-05-13 (×2): 20 mg via ORAL
  Filled 2024-05-12 (×2): qty 1

## 2024-05-12 NOTE — Discharge Planning (Signed)
 SW scheduled appointment for patient with SAIOP upstairs on June 11th at 1:00. Instructions are provided in patients AVS. Per MD she is stable for DC tomorrow. Her spouse will transport her home.

## 2024-05-12 NOTE — ED Notes (Signed)
 Pt observed in dayroom.  Bright affect.  Calm and cooperative. Q 15 minute observations continue for safety

## 2024-05-12 NOTE — Discharge Instructions (Addendum)
  Base on the information you have provided and the presenting issue, outpatient services and resources for have been recommended.  It is imperative that you follow through with treatment recommendations within 5-7 days from the of discharge to mitigate further risk to your safety and mental well-being. You are scheduled for your appointment on Wednesday June 11th @ 1:00 on the second floor for initial assessment.  [facility] [address] SECOND FLOOR [place], [place] [address] [phone]        [facility]   Specialized Group Therapy for Substance Abuse If you have a substance abuse disorder (with or without a mental health condition), you may benefit from specialized substance abuse therapy in a group setting. According to discharge survey data, 70 percent of patients completing our chemical dependency intensive outpatient program report fewer symptoms of substance abuse and incidents of relapse.  Under the guidance of a licensed mental health professional, meet with your peers every Monday, Wednesday and Friday from 9 a.m. to 12 p.m. for 6 to 8 weeks to:  Learn about chemical dependency, mental illness and co-occurring disorders. Develop relapse-prevention skills. Set personalized goals with your treatment team. To build on the skills you gain, you can attend Alcoholics Anonymous or Narcotics Anonymous meetings in the evenings and access follow-up care through weekly group meetings with peers. Ongoing support promotes wellness and recovery.  For more information, call Melynda Stagger, LCSW at [phone]. We work directly with employers and families to ensure you receive the care you need.

## 2024-05-12 NOTE — ED Notes (Signed)
Patient is sleeping. Respirations equal and unlabored, skin warm and dry, NAD. No change in assessment or acuity. Routine safety checks conducted according to facility protocol. Will continue to monitor for safety.   

## 2024-05-12 NOTE — Discharge Planning (Signed)
 LCSW met with patient to assess current mood, affect, physical state, and inquire about needs/goals while here in [facility] and after discharge. Patient is alert and oriented x 4, calm mood and congruent affect, attentive with appropriate speech and goal oriented. Patient presented due to alcohol detox. Patient reports she was drinking 4-5 vodka and soda's per night and usually began drinking around 5:00 pm. She stated that she had been doing this for several years and could not recall how many. Patient stated that she felt her drinking had gotten out of control because she "didn't know when to stop some nights". She admits to having recurrent falls due to drinking.   Patient is married with no children and lives with supportive spouse, 2 dogs and is retired from the bank. Patient denies any prior history of outpatient or inpatient substance abuse treatment. Patient currently denies any SI/HI/AVH and reports mood as calm. Patient aware that LCSW will assist with referral for appointment with CDIOP services here at [facility]  Patient expressed understanding and appreciation of LCSW assistance. No other needs were reported at this time by patient.

## 2024-05-12 NOTE — ED Notes (Signed)
 Pt continues to be calm, pleasant and cooperative.  Observable in dayroom watching TV and interacting with peers Q 15 minute observations for safety continue

## 2024-05-12 NOTE — ED Notes (Signed)
 Patient is sleeping. Respirations equal and unlabored, skin warm and dry. No change in assessment or acuity. Routine safety checks conducted according to facility protocol. Will continue to monitor for safety.

## 2024-05-12 NOTE — ED Notes (Signed)
 Pt observed in the dayroom in AA meeting.

## 2024-05-12 NOTE — ED Provider Notes (Signed)
 Behavioral Health Progress Note  Date and Time: 05/12/2024 6:27 PM Name: Lynn Cummings MRN:  [id number]  [id number] Muzyka is a 68 yr old female who presented on 5/30 to [facility] with her husband requesting help to detox off alcohol. Patient was admitted to the [facility] on 5/30. PPHx is significant for EtOH Abuse.   Patient assessment, 6/3: Pt presents today with a euthymic mood & her attention to personal hygiene and grooming is fair, eye contact is good, speech is clear & coherent. Thought contents are organized and logical, and pt currently denies SI/HI/AVH or paranoia. There is no evidence of delusional thoughts.    Pt reports that sleep quality is good, appetite is fair and she is making efforts to eat, she denies being in physical pain today, denies medication related side effects, denies issues with bowel movements.  Patient states that she does not take any medications at home with the exception of Protonix, potassium, and multivitamins.  Writer agreeable to ordering Protonix and multivitamins, but is unsure of how much potassium patient takes at home, and as a result, educated patient that ordering potassium will be deferred to her PCP and for her to follow up with us  after discharge.  Patient verbalizes readiness for discharge, case discussed with the attending psychiatrist, who is agreeable.  CSW was able to secure an outpatient appointment for intensive substance abuse here at the second floor of the [facility] behavioral [facility].  Please see note from CSW from today.  We will plan to discharge patient tomorrow, 6/4. We discussed medication management of alcohol use disorder including medication such as naltrexone and gabapentin, but the patient is agreeable to deferring this for her IOP program.  Diagnosis:  Final diagnoses:  Alcohol abuse   Total Time spent with patient: 45 minutes  Sleep: Good  Appetite:  Fair  Current Medications:  Current Facility-Administered  Medications  Medication Dose Route Frequency Provider Last Rate Last Admin   acetaminophen  (TYLENOL ) tablet 650 mg  650 mg Oral Q6H PRN Gilman Lade, NP       alum & mag hydroxide-simeth (MAALOX/MYLANTA) 200-200-20 MG/5ML suspension 30 mL  30 mL Oral Q4H PRN Lorrene Rosser, Veronique M, NP       loratadine (CLARITIN) tablet 10 mg  10 mg Oral Daily Pashayan, Alexander S, DO   10 mg at 05/12/24 5643   magnesium  hydroxide (MILK OF MAGNESIA) suspension 30 mL  30 mL Oral Daily PRN Byungura, Veronique M, NP       multivitamin with minerals tablet 1 tablet  1 tablet Oral Daily Byungura, Veronique M, NP   1 tablet at 05/12/24 3295   OLANZapine  (ZYPREXA ) injection 5 mg  5 mg Intramuscular TID PRN Gilman Lade, NP       OLANZapine  zydis (ZYPREXA ) disintegrating tablet 5 mg  5 mg Oral TID PRN Gilman Lade, NP       thiamine  (VITAMIN B1) tablet 100 mg  100 mg Oral Daily Byungura, Veronique M, NP   100 mg at 05/12/24 1884   Current Outpatient Medications  Medication Sig Dispense Refill   pantoprazole (PROTONIX) 40 MG tablet Take 40 mg by mouth daily.     potassium chloride  SA (KLOR-CON  M) 20 MEQ tablet Take 1 tablet (20 mEq total) by mouth 2 (two) times daily. 10 tablet 0    Labs  Lab Results:  Admission on 05/08/2024  Component Date Value Ref Range Status   WBC 05/08/2024 4.7  4.0 - 10.5 K/uL Final  RBC 05/08/2024 4.00  3.87 - 5.11 MIL/uL Final   Hemoglobin 05/08/2024 12.9  12.0 - 15.0 g/dL Final   HCT 09/81/1914 39.2  36.0 - 46.0 % Final   MCV 05/08/2024 98.0  80.0 - 100.0 fL Final   MCH 05/08/2024 32.3  26.0 - 34.0 pg Final   MCHC 05/08/2024 32.9  30.0 - 36.0 g/dL Final   RDW 78/29/5621 14.0  11.5 - 15.5 % Final   Platelets 05/08/2024 183  150 - 400 K/uL Final   nRBC 05/08/2024 0.0  0.0 - 0.2 % Final   Neutrophils Relative % 05/08/2024 60  % Final   Neutro Abs 05/08/2024 2.9  1.7 - 7.7 K/uL Final   Lymphocytes Relative 05/08/2024 30  % Final   Lymphs Abs 05/08/2024 1.4   0.7 - 4.0 K/uL Final   Monocytes Relative 05/08/2024 8  % Final   Monocytes Absolute 05/08/2024 0.4  0.1 - 1.0 K/uL Final   Eosinophils Relative 05/08/2024 1  % Final   Eosinophils Absolute 05/08/2024 0.1  0.0 - 0.5 K/uL Final   Basophils Relative 05/08/2024 1  % Final   Basophils Absolute 05/08/2024 0.0  0.0 - 0.1 K/uL Final   Immature Granulocytes 05/08/2024 0  % Final   Abs Immature Granulocytes 05/08/2024 0.01  0.00 - 0.07 K/uL Final   Performed at [facility], [address], [place], [place] [address]   Sodium 05/08/2024 144  135 - 145 mmol/L Final   Potassium 05/08/2024 3.5  3.5 - 5.1 mmol/L Final   Chloride 05/08/2024 102  98 - 111 mmol/L Final   CO2 05/08/2024 23  22 - 32 mmol/L Final   Glucose, Bld 05/08/2024 71  70 - 99 mg/dL Final   Glucose reference range applies only to samples taken after fasting for at least 8 hours.   BUN 05/08/2024 10  8 - 23 mg/dL Final   Creatinine, Ser 05/08/2024 0.78  0.44 - 1.00 mg/dL Final   Calcium 78/46/9629 9.6  8.9 - 10.3 mg/dL Final   Total Protein 52/84/1324 7.3  6.5 - 8.1 g/dL Final   Albumin 40/09/2724 4.4  3.5 - 5.0 g/dL Final   AST 36/64/4034 43 (H)  15 - 41 U/L Final   ALT 05/08/2024 20  0 - 44 U/L Final   Alkaline Phosphatase 05/08/2024 105  38 - 126 U/L Final   Total Bilirubin 05/08/2024 0.4  0.0 - 1.2 mg/dL Final   GFR, Estimated 05/08/2024 >60  >60 mL/min Final   Comment: (NOTE) Calculated using the CKD-EPI Creatinine Equation (2021)    Anion gap 05/08/2024 19 (H)  5 - 15 Final   Performed at [facility], [address], [place], [place] [address]   Hgb A1c MFr Bld 05/08/2024 5.0  4.8 - 5.6 % Final   Comment: (NOTE) Diagnosis of Diabetes The following HbA1c ranges recommended by the American Diabetes Association (ADA) may be used as an aid in the diagnosis of diabetes mellitus.  Hemoglobin             Suggested A1C NGSP%              Diagnosis  <5.7                   Non Diabetic  5.7-6.4                 Pre-Diabetic  >6.4  Diabetic  <7.0                   Glycemic control for                       adults with diabetes.     Mean Plasma Glucose 05/08/2024 96.8  mg/dL Final   Performed at [facility], [address] [place], [place] [address]   Magnesium  05/08/2024 1.6 (L)  1.7 - 2.4 mg/dL Final   Performed at [facility], [address], [place], [place] [address]   Alcohol, Ethyl (B) 05/08/2024 78 (H)  <15 mg/dL Final   Comment: (NOTE) For medical purposes only. Performed at [facility], [address] [place], [place] [address]    Cholesterol 05/08/2024 300 (H)  0 - 200 mg/dL Final   Triglycerides 18/84/1660 51  <150 mg/dL Final   HDL 63/12/6008 >135  >40 mg/dL Final   Total CHOL/HDL Ratio 05/08/2024 NOT CALCULATED  RATIO Final   VLDL 05/08/2024 10  0 - 40 mg/dL Final   LDL Cholesterol 05/08/2024 NOT CALCULATED  0 - 99 mg/dL Final   Performed at [facility], [address], [place], [place] [address]   TSH 05/08/2024 5.296 (H)  0.350 - 4.500 uIU/mL Final   Comment: Performed by a 3rd Generation assay with a functional sensitivity of <=0.01 uIU/mL. Performed at [facility], [address], [place], [place] [address]    RPR Ser Ql 05/08/2024 NON REACTIVE  NON REACTIVE Final   Performed at [facility], [address], [place], [place] [address]    Blood Alcohol level:  Lab Results  Component Value Date   ETH 78 (H) 05/08/2024    Metabolic Disorder Labs: Lab Results  Component Value Date   HGBA1C 5.0 05/08/2024   MPG 96.8 05/08/2024   No results found for: "PROLACTIN" Lab Results  Component Value Date   CHOL 300 (H) 05/08/2024   TRIG 51 05/08/2024   HDL >135 05/08/2024   CHOLHDL NOT CALCULATED 05/08/2024   VLDL 10 05/08/2024   LDLCALC NOT CALCULATED 05/08/2024    Therapeutic Lab Levels: No results found for: "LITHIUM" No results found for: "VALPROATE" No results found for: "CBMZ"  Physical Findings    PHQ2-9    Flowsheet Row ED from 05/08/2024 in [facility] Most recent reading at 05/08/2024 10:02 PM ED from 05/08/2024 in [facility] Most recent reading at 05/08/2024  8:40 PM  PHQ-2 Total Score 2 0  PHQ-9 Total Score 4 --      Flowsheet Row ED from 05/08/2024 in [facility] Most recent reading at 05/09/2024  9:02 PM ED from 05/08/2024 in [facility] Most recent reading at 05/08/2024  6:15 PM ED from 10/11/2023 in [facility] Emergency Department at [facility] Most recent reading at 10/11/2023 12:37 AM  C-SSRS RISK CATEGORY No Risk No Risk No Risk        Musculoskeletal  Strength & Muscle Tone: within normal limits Gait & Station: normal Patient leans: N/A  Psychiatric Specialty Exam  Presentation  General Appearance:  Appropriate for Environment; Fairly Groomed  Eye Contact: Fair  Speech: Clear and Coherent  Speech Volume: Normal  Handedness: Right   Mood and Affect  Mood: Depressed; Anxious  Affect: Congruent   Thought Process  Thought Processes: Coherent  Descriptions of Associations:Intact  Orientation:Full (Time, Place and Person)  Thought Content:Logical  Diagnosis of Schizophrenia or Schizoaffective disorder in past: No    Hallucinations:Hallucinations: None  Ideas of Reference:None  Suicidal Thoughts:Suicidal Thoughts: No  Homicidal Thoughts:Homicidal Thoughts: No   Sensorium  Memory: Immediate Fair  Judgment: Fair  Insight: Fair   Art therapist  Concentration: Fair  Attention Span: Fair  Recall: Fair  Fund of Knowledge: Fair  Language: Fair   Psychomotor Activity  Psychomotor Activity:Psychomotor Activity: Normal   Assets  Assets: Resilience   Sleep  Sleep:Sleep: Fair   No data recorded  Physical Exam  Physical Exam Constitutional:      Appearance: Normal appearance.   Musculoskeletal:        General: Normal range of motion.     Cervical back: Normal range of motion.  Neurological:     General: No focal deficit present.     Mental Status: She is alert and oriented to person, place, and time.    Review of Systems  Psychiatric/Behavioral:  Positive for depression and substance abuse. Negative for hallucinations, memory loss and suicidal ideas. The patient is nervous/anxious and has insomnia.   All other systems reviewed and are negative.  Blood pressure 128/75, pulse 64, temperature 97.6 F (36.4 C), temperature source Oral, resp. rate 16, SpO2 99%. There is no height or weight on file to calculate BMI.  Treatment Plan Summary: Daily contact with patient to assess and evaluate symptoms and progress in treatment and Medication management - Start multivitamins daily for nutritional supplementation -Start Protonix 20 mg daily for GERD -Continue as needed medications as listed on the medication administration record.  Labs reviewed: TSH is elevated at 5.296, ordered free T3 and free T4.  Cholesterol is elevated at 300, patient has been educated on healthy food choices and exercise, and has been educated on the need to follow up with her primary care provider, verbalizes understanding.  Robet Chiquito, NP 05/12/2024 6:27 PM

## 2024-05-12 NOTE — Group Note (Signed)
 Group Topic: Relapse and Recovery  Group Date: 05/12/2024 Start Time: 1900 End Time: 2000 Facilitators: Wendall Halls B  Department: [facility]  Number of Participants: 6  Group Focus: abuse issues, activities of daily living skills, and coping skills Treatment Modality:  Psychoeducation Interventions utilized were group exercise Purpose: enhance coping skills, express feelings, relapse prevention strategies, and trigger / craving management  Name: Sherlie Boyum Date of Birth: 1956/05/28  MR: [id number]    Level of Participation: active Quality of Participation: attentive and cooperative Interactions with others: gave feedback Mood/Affect: appropriate Triggers (if applicable): NA Cognition: coherent/clear Progress: Gaining insight Response: NA Plan: patient will be encouraged to keep going to groups.   Patients Problems:  Patient Active Problem List   Diagnosis Date Noted   Alcohol use disorder 05/08/2024   Alcohol abuse 05/08/2024

## 2024-05-12 NOTE — ED Notes (Signed)
 Pt presented sitting in dayroom, calm and cooperative.  Pt shares that the POC, from her understanding, is going to outpatient treatment.   Denied SI/Hi and A/V hallucinationsQ 15 minute observations for safety continue

## 2024-05-13 DIAGNOSIS — F1010 Alcohol abuse, uncomplicated: Secondary | ICD-10-CM | POA: Diagnosis not present

## 2024-05-13 DIAGNOSIS — F1994 Other psychoactive substance use, unspecified with psychoactive substance-induced mood disorder: Secondary | ICD-10-CM

## 2024-05-13 DIAGNOSIS — F330 Major depressive disorder, recurrent, mild: Secondary | ICD-10-CM | POA: Diagnosis not present

## 2024-05-13 LAB — T4, FREE: Free T4: 0.65 ng/dL (ref 0.61–1.12)

## 2024-05-13 NOTE — ED Notes (Signed)
 Patient discharged home per MD order. After Visit Summary (AVS) printed and given to patient. AVS reviewed with patient and all questions fully answered. Patient discharged in no acute distress, A&O x4 and ambulatory. Patient denied SI/HI, A/VH upon discharge. Patient verbalized understanding of all discharge instructions explained by staff, including follow up appointments, RX's and safety plan. Patient mood fair. Patient belongings returned to patient from locker #14 complete and intact. Patient escorted to lobby via staff for transport to destination. Safety maintained.

## 2024-05-13 NOTE — ED Notes (Signed)
 Patient alert & oriented x4. Denies intent to harm self or others when asked. Denies A/VH. Patient denies any physical complaints when asked. No acute distress noted. Scheduled medications administered with no complications. Patient set to discharge home later this afternoon around 1600, no complaints voiced regarding this plan. Support and encouragement provided. Routine safety checks conducted per facility protocol. Encouraged patient to notify staff if any thoughts of harm towards self or others arise. Patient verbalizes understanding and agreement.

## 2024-05-13 NOTE — Group Note (Signed)
 Group Topic: Wellness  Group Date: 05/13/2024 Start Time: 1720 End Time: 1745 Facilitators: Pennie Box, RN  Department: [facility]  Number of Participants: 10  Group Focus: nursing group Treatment Modality:  Skills Training Interventions utilized were group exercise and patient education Purpose: improve communication skills  Name: Lynn Cummings Date of Birth: 02-20-1956  MR: [id number]    Level of Participation: active Quality of Participation: attentive and cooperative Interactions with others: gave feedback Mood/Affect: appropriate Triggers (if applicable): n/a Cognition: coherent/clear and goal directed Progress: Significant Response: pt says she will help establish healthy boundaries by being a better communicator  Plan: patient will be encouraged to attend future RN groups  Patients Problems:  Patient Active Problem List   Diagnosis Date Noted   Alcohol use disorder 05/08/2024   Alcohol abuse 05/08/2024

## 2024-05-13 NOTE — Group Note (Signed)
 Group Topic: Relapse and Recovery  Group Date: 05/13/2024 Start Time: 1850 End Time: 1859 Facilitators: Corinda Ammon, [place], RN  Department: [facility]  Number of Participants: 1  Group Focus: discharge education Treatment Modality:  Individual Therapy Interventions utilized were patient education Purpose: increase insight  Name: Tylesha Gibeault Date of Birth: [birth year]  MR: [id number]    Level of Participation: active Quality of Participation: attentive, cooperative, and engaged Interactions with others: gave feedback Mood/Affect: appropriate Triggers (if applicable): None identified at this time Cognition: coherent/clear and logical Progress: Significant Response: AVS reviewed with patient as well as suicide safety plan. No questions voiced at this time, patient voices understanding of all discharge instructions as presented to them. Facility contact information reviewed in case of any further concerns. Plan: patient will be encouraged to refer to resources as needed, attend OP therapy appointment as scheduled.  Patients Problems:  Patient Active Problem List   Diagnosis Date Noted   Alcohol use disorder 05/08/2024   Alcohol abuse 05/08/2024

## 2024-05-13 NOTE — Group Note (Signed)
 Group Topic: Positive Affirmations  Group Date: 05/13/2024 Start Time: 1130 End Time: 1150 Facilitators: Esther Hem, NT  Department: [facility]  Number of Participants: 8  Group Focus: positive affirmation and other self worth Treatment Modality:  Psychoeducation Interventions utilized were support Purpose: increase insight  Name: Lynn Cummings Date of Birth: 1956/04/20  MR: [id number]    Level of Participation: active Quality of Participation: attentive and cooperative Interactions with others: gave feedback Mood/Affect: appropriate Triggers (if applicable): n/a Cognition: coherent/clear Progress: Gaining insight Response: PT stated that she was still learning herself Plan: patient will be encouraged to attend groups  Patients Problems:  Patient Active Problem List   Diagnosis Date Noted   Alcohol use disorder 05/08/2024   Alcohol abuse 05/08/2024

## 2024-05-13 NOTE — ED Notes (Signed)
 Patient resting with eyes closed in no apparent acute distress. Respirations even and unlabored. Environment secured. Safety checks in place according to facility policy.

## 2024-05-13 NOTE — ED Provider Notes (Signed)
 FBC/OBS ASAP Discharge Summary  Date and Time: 05/13/2024 12:22 PM  Name: Lynn Cummings  MRN:  [id number]   Discharge Diagnoses:  Final diagnoses:  Alcohol abuse  Substance induced mood disorder (HCC)  Mild episode of recurrent major depressive disorder (HCC)   Subjective: Patient endorses good mood this morning. She is eager to discharge home this afternoon. She would like to retrieve clothing from locker so she can bathe and change before her husband picks her up.Sleep and appetite are good. She continues to consider medication after discharge to help with her substance issues but denies depression/anxiety.   Stay Summary: Lynn Cummings is a 68 year-old female who presents to [facility] voluntarily, accompanied by her husband Devaeh Amadi [phone], with chief complaint of alcohol abuse. She reports that this is the first time she seeks help with her alcohol problem. Patient reports she has never had any treatment for her alcohol problem. Reports that she has been drinking  for many years but for the last couple of days, the use has increased more and more and safety is becoming an issue.    Patient allows spouse to provide information regarding her drinking problem. Spouse reports that they have been married for over 30 years. Spouse does not use alcohol.  Patient has used alcohol for many years "we thought it was just the young age, and I stayed non-judgemental, because I love and care about her.  For the past couple of years, she has been drinking  and falling down a lot. Yesterday, I found her sitting on the toilet, all wet, and looked sad, and she did not look herself. She was confused and could hardly talk. She did not eat, was just drinking. She usually drinks Vodka, every day".    Patient reports this is the first time she seeks help with her alcohol problem "because its becoming dangerous". Admits to experiencing multiple falls  resulting in injuries when intoxicated and  currently under medical treatment for left eye injury. Admits to drinking vodka every day. Denies using beer or wine.  Reports no trigger. Reports no hx of depression or anxiety.    Patient is retired and stays home with their 2 dogs. She buys  alcohol at the liquor store, and drinks throughout the day when spouse is at work.  She denies hx of addiction in her family of origin. Patient and spouse have been married for over 30 years and have no children.  Parents and siblings are deceased. Spouse is supportive and encouraging.    Treatment options are provided through patient education and she reports she would like to start with detox and not decided about rehab services for now. Patient admitted to [facility] with CIWA and Ativan  detox protocol.   Lorazepam  not indicated and CIWA discontinued without difficulty. Patient considered residential substance treatment but eventually decided upon IOP. Although some dysthymia/anxiety evident, substance treatment (AUD) was the primary indication for pharmacological intervention. Initial discussions of MAT such as naltrexone and acamprosate but deferred to IOP. Discussed hypomagnesemia and need to separate Ca++ intake to improve absorption of Mg (patient currently takes combination supplement).  Labs grossly wnl but consider monitoring thyroid function. 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   Total Time spent with patient: 15 minutes  Current Medications:  Current Facility-Administered Medications  Medication Dose Route Frequency Provider Last Rate Last Admin   acetaminophen  (TYLENOL ) tablet 650 mg  650 mg Oral Q6H PRN Gilman Lade, NP       alum &  mag hydroxide-simeth (MAALOX/MYLANTA) 200-200-20 MG/5ML suspension 30 mL  30 mL Oral Q4H PRN Lorrene Rosser, Veronique M, NP       loratadine (CLARITIN) tablet 10 mg  10 mg Oral Daily Pashayan, Alexander S, DO   10 mg at 05/13/24 1039   magnesium  hydroxide (MILK OF MAGNESIA) suspension 30 mL  30 mL  Oral Daily PRN Byungura, Veronique M, NP       multivitamin with minerals tablet 1 tablet  1 tablet Oral Daily Byungura, Veronique M, NP   1 tablet at 05/13/24 1039   OLANZapine  (ZYPREXA ) injection 5 mg  5 mg Intramuscular TID PRN Byungura, Veronique M, NP       OLANZapine  zydis (ZYPREXA ) disintegrating tablet 5 mg  5 mg Oral TID PRN Lorrene Rosser, Veronique M, NP       pantoprazole (PROTONIX) EC tablet 20 mg  20 mg Oral Daily Nkwenti, Doris, NP   20 mg at 05/13/24 1039   thiamine  (VITAMIN B1) tablet 100 mg  100 mg Oral Daily Lorrene Rosser, Veronique M, NP   100 mg at 05/13/24 1039   Current Outpatient Medications  Medication Sig Dispense Refill   pantoprazole (PROTONIX) 40 MG tablet Take 40 mg by mouth daily.     potassium chloride  SA (KLOR-CON  M) 20 MEQ tablet Take 1 tablet (20 mEq total) by mouth 2 (two) times daily. 10 tablet 0    PTA Medications:  Facility Ordered Medications  Medication   acetaminophen  (TYLENOL ) tablet 650 mg   alum & mag hydroxide-simeth (MAALOX/MYLANTA) 200-200-20 MG/5ML suspension 30 mL   magnesium  hydroxide (MILK OF MAGNESIA) suspension 30 mL   OLANZapine  zydis (ZYPREXA ) disintegrating tablet 5 mg   OLANZapine  (ZYPREXA ) injection 5 mg   [COMPLETED] thiamine  (VITAMIN B1) injection 100 mg   thiamine  (VITAMIN B1) tablet 100 mg   multivitamin with minerals tablet 1 tablet   [EXPIRED] LORazepam  (ATIVAN ) tablet 1 mg   [EXPIRED] hydrOXYzine  (ATARAX ) tablet 25 mg   [EXPIRED] loperamide  (IMODIUM ) capsule 2-4 mg   [EXPIRED] ondansetron  (ZOFRAN -ODT) disintegrating tablet 4 mg   loratadine (CLARITIN) tablet 10 mg   [COMPLETED] hydrOXYzine  (ATARAX ) tablet 25 mg   pantoprazole (PROTONIX) EC tablet 20 mg   PTA Medications  Medication Sig   pantoprazole (PROTONIX) 40 MG tablet Take 40 mg by mouth daily.   potassium chloride  SA (KLOR-CON  M) 20 MEQ tablet Take 1 tablet (20 mEq total) by mouth 2 (two) times daily.       05/13/2024   12:17 PM 05/08/2024   10:02 PM 05/08/2024     8:40 PM  Depression screen PHQ 2/9  Decreased Interest 1 1 0  Down, Depressed, Hopeless 1 1 0  PHQ - 2 Score 2 2 0  Altered sleeping 0 1   Tired, decreased energy 1 0   Change in appetite 1 0   Feeling bad or failure about yourself  1 0   Trouble concentrating 1 1   Moving slowly or fidgety/restless 0 0   Suicidal thoughts 0 0   PHQ-9 Score 6 4   Difficult doing work/chores Somewhat difficult Somewhat difficult     Flowsheet Row ED from 05/08/2024 in [facility] Most recent reading at 05/09/2024  9:02 PM ED from 05/08/2024 in [facility] Most recent reading at 05/08/2024  6:15 PM ED from 10/11/2023 in [facility] Emergency Department at [facility] Most recent reading at 10/11/2023 12:37 AM  C-SSRS RISK CATEGORY No Risk No Risk No Risk  Psychiatric Specialty Exam  Presentation  General Appearance:  Appropriate for Environment; Fairly Groomed  Eye Contact: Fair  Speech: Clear and Coherent  Speech Volume: Normal  Mood and Affect  Mood: good  Affect: Congruent  Thought Process  Thought Processes: goal-directed, Coherent  Descriptions of Associations:Intact  Orientation:Full (Time, Place and Person)  Thought Content:Logical  Diagnosis of Schizophrenia or Schizoaffective disorder in past: No    Hallucinations:Hallucinations: None  Ideas of Reference:None  Suicidal Thoughts:Suicidal Thoughts: No  Homicidal Thoughts:Homicidal Thoughts: No  Sensorium  Memory: Immediate Fair  Judgment: Fair  Insight: Fair  Art therapist  Concentration: Fair  Attention Span: Fair  Recall: Fair  Fund of Knowledge: Fair  Language: Fair  Psychomotor Activity  Psychomotor Activity: grossly wnl for age  Assets  Assets: Resilience   Sleep  Sleep: good  Physical Exam  Physical Exam ROS Blood pressure (!) 144/80, pulse 70, temperature 98.2 F (36.8 C), temperature source Oral, resp.  rate 16, SpO2 100%. There is no height or weight on file to calculate BMI.  Demographic Factors:  Age 15 or older  Loss Factors: Decline in physical health  Historical Factors: NA  Risk Reduction Factors:   Living with another person, especially a relative  Continued Clinical Symptoms:  Alcohol/Substance Abuse/Dependencies  Cognitive Features That Contribute To Risk:  None    Suicide Risk:  Minimal: No identifiable suicidal ideation.  Patients presenting with no risk factors but with morbid ruminations; may be classified as minimal risk based on the severity of the depressive symptoms  Plan Of Care/Follow-up recommendations:  Other:  Optimize diet with produce, whole (minimally processed) grains, nuts/seeds, beans/legumes, seafood, lowfat dairy (if tolerated), lean meat, fermented foods. Encourage water intake. Limit (ultra)processed foods and sugar-sweetened beverages.  If dietary Mg sources inadequate, consider Mg supplement (glycinate, malate, lactate, threonate) consumed separate from competing divalent cations (particularly calcium - including dietary sources). Encourage daily physical activity with approval from PCP.  Naltrexone and acamprosate should be consider for MAT.  Disposition: Discharge home with f/u with IOP 6/11.  Nicklas Barns, MD 05/13/2024, 12:22 PM

## 2024-05-13 NOTE — Group Note (Unsigned)
 Group Topic: Boundaries  Group Date: 05/13/2024 Start Time: 0300 End Time: 0400 Facilitators: Merline Starr, [place]  Department: [facility]  Number of Participants: 9  Group Focus: coping skills Treatment Modality:  Psychoeducation Interventions utilized were patient education Purpose: explore maladaptive thinking, improve communication skills, reinforce self-care, and trigger / craving management   Name: Dulcie Gammon Date of Birth: 01-26-1956  MR: [id number]    Level of Participation: {THERAPIES; PSYCH GROUP PARTICIPATION WUJWJ:19147} Quality of Participation: {THERAPIES; PSYCH QUALITY OF PARTICIPATION:23992} Interactions with others: {THERAPIES; PSYCH INTERACTIONS:23993} Mood/Affect: {THERAPIES; PSYCH MOOD/AFFECT:23994} Triggers (if applicable): *** Cognition: {THERAPIES; PSYCH COGNITION:23995} Progress: {THERAPIES; PSYCH PROGRESS:23997} Response: *** Plan: {THERAPIES; PSYCH WGNF:62130}  Patients Problems:  Patient Active Problem List   Diagnosis Date Noted   Alcohol use disorder 05/08/2024   Alcohol abuse 05/08/2024

## 2024-05-13 NOTE — ED Notes (Signed)
 Patient is sleeping. Respirations equal and unlabored, skin warm and dry. No change in assessment or acuity. Routine safety checks conducted according to facility protocol. Will continue to monitor for safety.

## 2024-05-13 NOTE — ED Notes (Signed)
 Patient sitting in dayroom calm and composed. No acute distress noted. No concerns voiced. Informed patient to notify staff with any needs or assistance. Patient verbalized understanding or agreement. Safety checks in place per facility policy.

## 2024-05-13 NOTE — ED Notes (Signed)
 Patient sitting in dayroom interacting with peers. No acute distress noted. No concerns voiced. Informed patient to notify staff with any needs or assistance. Patient verbalized understanding or agreement. Safety checks in place per facility policy.

## 2024-05-14 LAB — T3, FREE: T3, Free: 3.3 pg/mL (ref 2.0–4.4)

## 2024-05-20 ENCOUNTER — Ambulatory Visit (HOSPITAL_COMMUNITY)

## 2024-05-20 ENCOUNTER — Telehealth (HOSPITAL_COMMUNITY): Payer: Self-pay

## 2024-05-20 NOTE — Telephone Encounter (Signed)
 Front desk sent an in [employer] noting Kristal could not make it today and wanted to reschedule. Therapist calls and she answers. Therapist confirms her identity by ob taining two verifiers.  Therapist asks if she can reschedule on this Friday, 05-22-24 at 8:30 a.m. and she says that this will be fine. She explains to this therapist that she goes by the name of Lynn Cummings. Therapist asks front desk to put her on the schedule for that date.  Earnie Gola, MS, LMFT, LCAS  05-20-24

## 2024-05-22 ENCOUNTER — Encounter (HOSPITAL_COMMUNITY): Payer: Self-pay

## 2024-05-22 ENCOUNTER — Ambulatory Visit (INDEPENDENT_AMBULATORY_CARE_PROVIDER_SITE_OTHER)

## 2024-05-22 DIAGNOSIS — F1020 Alcohol dependence, uncomplicated: Secondary | ICD-10-CM | POA: Diagnosis not present

## 2024-05-22 NOTE — Progress Notes (Signed)
 THERAPIST PROGRESS NOTE  Session Time: 9:22 am  Type of Therapy: Individual   Therapist Response/Interventions: CBT/Psychoeducation: Discussed overall recovery process, Gather information to update CCA  Treatment Goals addressed: Problem: Substance Use  Dates: Start:  05/22/24    Disciplines: Interdisciplinary, PROVIDER  Goal: Lynn Cummings will abstain 30/30 days per month based on self-report and weekly UDS while attending 12 step meetings, obtain a sponsor and begging step work per her report  Dates: Start:  05/22/24   Expected End:  08/22/24    Disciplines: Interdisciplinary, PROVIDER  Intervention: Therapist will educate Lynn Cummings about SUDS, Patterns and consequences of use, relapse risk, the treatment process and types of mutual groups and provide early recovery and relapse prevention skills  Dates: Start:  05/22/24      Summary: Lynn Cummings presents today having been referred from [facility]. She states she forgot Monday's appointment.  A CCA was completed on 5?30/25.  Lynn Cummings reports She started out drinking socially and her use of alcohol became out of my control over the past couple of years. She reports her first drink was at age 66 and she had started drinking more when she was working and then more when she retired in February. She says boredom as a trigger.    Lynn Cummings says she has family members that drink socially but none suffer from addiction. Lynn Cummings says when she starts drinking she does not feel like she can limit the amount.   Lynn Cummings says she has had a lot of accident of falling down and hurting herself while intoxicated. So she says she knows she needs to quit so as not to develop further health problems. She says her husband is happy she made the decision to stop her use.  Lynn Cummings says her last drink was on May 29th, 2025 prior to entering [facility].   Lynn Cummings scores a 2 on the PHQ-9 and a 0 on the GAD-7  Lynn Cummings meets criteria for Level II , CDIOP, as she endorses a  '3 o relapse, continued  use or continued Problem Potential: She has little  recognition and understand of substanse use relapse issues, and poor skills to interrupt addiction problems or to avoid or limit relapse.   Lynn Cummings says she does not attend meetings or have a sponsor but will consider attending meetings.   Progress Towards Goals: initial  Suicidal/Homicidal: denies  Plan: Begin CDIOPon Monday, 05-25-2024  Diagnosis: Alcohol Use Disorder, Severe  Collaboration of Care: N/A  Patient/Guardian was advised Release of Information must be obtained prior to any record release in order to collaborate their care with an outside provider. Patient/Guardian was advised if they have not already done so to contact the registration department to sign all necessary forms in order for us  to release information regarding their care.   Consent: Patient/Guardian gives verbal consent for treatment and assignment of benefits for services provided during this visit. Patient/Guardian expressed understanding and agreed to proceed.   Mariah Shines Devynne Sturdivant,MS.  LMFT, LCAS

## 2024-05-25 ENCOUNTER — Ambulatory Visit (INDEPENDENT_AMBULATORY_CARE_PROVIDER_SITE_OTHER)

## 2024-05-25 VITALS — BP 137/79 | HR 72

## 2024-05-25 DIAGNOSIS — F1020 Alcohol dependence, uncomplicated: Secondary | ICD-10-CM

## 2024-05-25 DIAGNOSIS — F418 Other specified anxiety disorders: Secondary | ICD-10-CM | POA: Diagnosis not present

## 2024-05-25 NOTE — Progress Notes (Signed)
 Daily Group Progress Note   Program: CD IOP     Group Time: 9 a.m. to 12 p.m.      Type of Therapy: Process and Psychoeducational    Topic: The therapists check in with group members, assess for SI/HI/psychosis and overall level of functioning. The therapists inquire about sobriety date and number of community support meetings attended since last session.   The therapist introduce the new group member. The therapists cover an array of topics today including the following: Boredom as a trigger, particular in reference to transitioning to one retiring and finding meaningful activities to fill their time; what addiction involves including physiology, genetic pre-disposition, black outs, definition of role of dopamine, the decrease of dopamine receptors with increased using time resulting in having to drink to feel normal: introduced Dr. Alonzo Artis video on the Update of Neurobiology of addiction.   Summary: This is Lynn Cummings first CD IOP group. She was referred from downstairs as she went to detox in late May, 2025. Chris rated her depression and anxiety as a 0. Lynn Cummings shares her story with the group. Lynn Cummings say she has always been a social drinker as several of her family members are.  She says she started drinking more prior to her retirement and began to have falling incidents and hurting herself. She says on one fall she fell into a glass door and cut her tear duct. She had this corrected surgically.  She says her husband was tiring of her increased falling and getting hurt.  Therapists shares with Lynn Cummings some of the basic information regarding recovery as she indicated she has never had any treatment previously.    Progress Towards Goals: Lynn Cummings reports her sobriety date as 05-08-24.  UDS collected: Yes Results: none  AA/NA attended?:  Yes   Sponsor?:  Yes   Earnie Gola, MS, LMFT, LCAS [address] [place], [place], [facility], LCAS  05/25/2024

## 2024-05-27 ENCOUNTER — Encounter (HOSPITAL_COMMUNITY): Payer: Self-pay | Admitting: Medical

## 2024-05-27 ENCOUNTER — Ambulatory Visit (HOSPITAL_COMMUNITY): Admitting: Medical

## 2024-05-27 VITALS — BP 124/70 | HR 66 | Ht 64.0 in | Wt 126.0 lb

## 2024-05-27 DIAGNOSIS — J302 Other seasonal allergic rhinitis: Secondary | ICD-10-CM

## 2024-05-27 DIAGNOSIS — F1020 Alcohol dependence, uncomplicated: Secondary | ICD-10-CM

## 2024-05-27 DIAGNOSIS — M47812 Spondylosis without myelopathy or radiculopathy, cervical region: Secondary | ICD-10-CM | POA: Diagnosis not present

## 2024-05-27 DIAGNOSIS — F418 Other specified anxiety disorders: Secondary | ICD-10-CM

## 2024-05-27 DIAGNOSIS — G250 Essential tremor: Secondary | ICD-10-CM

## 2024-05-27 DIAGNOSIS — Z8719 Personal history of other diseases of the digestive system: Secondary | ICD-10-CM

## 2024-05-27 DIAGNOSIS — E786 Lipoprotein deficiency: Secondary | ICD-10-CM

## 2024-05-27 DIAGNOSIS — R7989 Other specified abnormal findings of blood chemistry: Secondary | ICD-10-CM

## 2024-05-27 DIAGNOSIS — K219 Gastro-esophageal reflux disease without esophagitis: Secondary | ICD-10-CM

## 2024-05-27 MED ORDER — BACLOFEN 10 MG PO TABS: 10.0000 mg | ORAL_TABLET | Freq: Three times a day (TID) | ORAL | 2 refills | Status: AC

## 2024-05-27 NOTE — Progress Notes (Unsigned)
 Daily Group Progress Note   Program: CD IOP     Group Time: 9 a.m. to 12 p.m.      Type of Therapy: Process and Psychoeducational    Topic: The therapists check in with group members, assess for SI/HI/psychosis and overall level of functioning. The therapists inquire about sobriety date and number of community support meetings attended since last session.  The therapists talk about the importance of avoiding triggers while emphasizing the importance of scheduling and the importance of sober supports.  The therapists review the different parts of the human brain explaining which parts are impacted by addiction while emphasizing that people will feel progressively better the longer they go without using and that their cravings and thoughts of using will get weaker and weaker the more they do not give into them.  The therapist explained that early recovery is particularly hard which is the reason that 12-step programs encourage people to do 90 meetings in 90 days with the therapists explaining that people can do multiple meetings in a day if they so choose.  The therapist explained that isolating and having too much time on one's hands is not a good idea for a person in early recovery.    Summary: Lynn Cummings presents today rating both her depression and anxiety as a 0.  She denies any changes in her sobriety date and describes her mood as peaceful and optimistic.  After meeting with the PA-C, the therapists inquire about her understanding of what is meant by the rat brain.  Lynn Cummings struggles with being able to describe this so a more in-depth description is given by the therapists and some of the other group members.  She has yet to get connected to any 12-step meetings but 2 of the other female group members inform her that they both attend the same women's only meeting on Saturday offering to accompany her if she would like to try this meeting out.  Lynn Cummings says that she would be willing to attend  with them.  She says that she will not be in group on Friday due to a medical appointment but will return on Monday.   Progress Towards Goals: Lynn Cummings reports no change in her sobriety date.  UDS collected: No Results: No   AA/NA attended?:  No   Sponsor?:  No    Lynn Gola, MS, LMFT, LCAS [address] [place], [place], [facility], LCAS   05/25/2024

## 2024-05-27 NOTE — Progress Notes (Incomplete)
 Psychiatric Initial Adult Assessment   Patient Identification: Lynn Cummings MRN:  [id number] Date of Evaluation:  05/27/2024 Referral Source: *** Chief Complaint:   Chief Complaint  Patient presents with   Establish Care   Alcohol Problem   Visit Diagnosis:    ICD-10-CM   1. Alcohol use disorder, severe, dependence (HCC)  F10.20     2. Elevated LFTs  R79.89    Alcohol related    3. S/P dilatation of esophageal stricture  Z98.890    Z87.19     4. High serum high density lipoprotein (HDL)  R79.89     5. Elevated ratio of cholesterol to high density lipoprotein (HDL)  E78.6       History of Present Illness:  ***  Associated Signs/Symptoms: Depression Symptoms:  {DEPRESSION SYMPTOMS:20000} (Hypo) Manic Symptoms:  {BHH MANIC SYMPTOMS:22872} Anxiety Symptoms:  {BHH ANXIETY SYMPTOMS:22873} Psychotic Symptoms:  {BHH PSYCHOTIC SYMPTOMS:22874} PTSD Symptoms: {BHH PTSD SYMPTOMS:22875}  Past Psychiatric History: ***  Previous Psychotropic Medications: {YES/NO:21197}  Substance Abuse History in the last 12 months:  {yes no:314532}  Consequences of Substance Abuse: {BHH CONSEQUENCES OF SUBSTANCE ABUSE:22880}  Past Medical History:  Past Medical History:  Diagnosis Date   Arthritis    High cholesterol    History reviewed. No pertinent surgical history.  Family Psychiatric History: ***  Family History: History reviewed. No pertinent family history.  Social History:   Social History   Socioeconomic History   Marital status: Married    Spouse name: Not on file   Number of children: Not on file   Years of education: Not on file   Highest education level: Not on file  Occupational History   Not on file  Tobacco Use   Smoking status: Never   Smokeless tobacco: Never  Substance and Sexual Activity   Alcohol use: Yes    Comment: ocassiona   Drug use: Not on file   Sexual activity: Not on file  Other Topics Concern   Not on file  Social History Narrative    Not on file   Social Drivers of Health   Financial Resource Strain: Not on file  Food Insecurity: No Food Insecurity (05/08/2024)   Hunger Vital Sign    Worried About Running Out of Food in the Last Year: Never true    Ran Out of Food in the Last Year: Never true  Transportation Needs: No Transportation Needs (05/08/2024)   PRAPARE - Administrator, Civil Service (Medical): No    Lack of Transportation (Non-Medical): No  Physical Activity: Not on file  Stress: Not on file  Social Connections: Not on file    Additional Social History: ***  Allergies:  No Known Allergies  Metabolic Disorder Labs: Lab Results  Component Value Date   HGBA1C 5.0 05/08/2024   MPG 96.8 05/08/2024   No results found for: PROLACTIN Lab Results  Component Value Date   CHOL 300 (H) 05/08/2024   TRIG 51 05/08/2024   HDL >135 05/08/2024   CHOLHDL NOT CALCULATED 05/08/2024   VLDL 10 05/08/2024   LDLCALC NOT CALCULATED 05/08/2024   Lab Results  Component Value Date   TSH 5.296 (H) 05/08/2024    Therapeutic Level Labs: No results found for: LITHIUM No results found for: CBMZ No results found for: VALPROATE  Current Medications: Current Outpatient Medications  Medication Sig Dispense Refill   baclofen (LIORESAL) 10 MG tablet Take 1 tablet (10 mg total) by mouth 3 (three) times daily. 90 tablet 2   pantoprazole  (  PROTONIX ) 40 MG tablet Take 40 mg by mouth daily.     potassium chloride  SA (KLOR-CON  M) 20 MEQ tablet Take 1 tablet (20 mEq total) by mouth 2 (two) times daily. 10 tablet 0   No current facility-administered medications for this visit.    Musculoskeletal: Strength & Muscle Tone: {desc; muscle tone:32375} Gait & Station: {PE GAIT ED ZOXW:96045} Patient leans: {Patient Leans:21022755}  Psychiatric Specialty Exam: Review of Systems  Blood pressure 124/70, pulse 66, height 5' 4 (1.626 m), weight 126 lb (57.2 kg), SpO2 100%.Body mass index is 21.63 kg/m.   General Appearance: {Appearance:22683}  Eye Contact:  {BHH EYE CONTACT:22684}  Speech:  {Speech:22685}  Volume:  {Volume (PAA):22686}  Mood:  {BHH MOOD:22306}  Affect:  {Affect (PAA):22687}  Thought Process:  {Thought Process (PAA):22688}  Orientation:  {BHH ORIENTATION (PAA):22689}  Thought Content:  {Thought Content:22690}  Suicidal Thoughts:  {ST/HT (PAA):22692}  Homicidal Thoughts:  {ST/HT (PAA):22692}  Memory:  {BHH MEMORY:22881}  Judgement:  {Judgement (PAA):22694}  Insight:  {Insight (PAA):22695}  Psychomotor Activity:  {Psychomotor (PAA):22696}  Concentration:  {Concentration:21399}  Recall:  {BHH GOOD/FAIR/POOR:22877}  Fund of Knowledge:{BHH GOOD/FAIR/POOR:22877}  Language: {BHH GOOD/FAIR/POOR:22877}  Akathisia:  {BHH YES OR NO:22294}  Handed:  {Handed:22697}  AIMS (if indicated):  {Desc; done/not:10129}  Assets:  {Assets (PAA):22698}  ADL's:  {BHH WUJ'W:11914}  Cognition: {chl bhh cognition:304700322}  Sleep:  {BHH GOOD/FAIR/POOR:22877}   Screenings: PHQ2-9    Flowsheet Row ED from 05/08/2024 in [facility] Most recent reading at 05/13/2024 12:17 PM ED from 05/08/2024 in [facility] Most recent reading at 05/08/2024  8:40 PM  PHQ-2 Total Score 2 0  PHQ-9 Total Score 6 --   Flowsheet Row ED from 05/08/2024 in [facility] Most recent reading at 05/09/2024  9:02 PM ED from 05/08/2024 in [facility] Most recent reading at 05/08/2024  6:15 PM ED from 10/11/2023 in [facility] Emergency Department at [facility] Most recent reading at 10/11/2023 12:37 AM  C-SSRS RISK CATEGORY No Risk No Risk No Risk    Assessment and Plan: ***  Collaboration of Care: {BH OP Collaboration of Care:21014065}  Patient/Guardian was advised Release of Information must be obtained prior to any record release in order to collaborate their care with an outside provider.  Patient/Guardian was advised if they have not already done so to contact the registration department to sign all necessary forms in order for us  to release information regarding their care.   Consent: Patient/Guardian gives verbal consent for treatment and assignment of benefits for services provided during this visit. Patient/Guardian expressed understanding and agreed to proceed.   Andria Banks, PA-C 6/18/20252:28 PM

## 2024-05-27 NOTE — Progress Notes (Signed)
 Error

## 2024-05-29 ENCOUNTER — Ambulatory Visit (HOSPITAL_COMMUNITY)

## 2024-06-01 ENCOUNTER — Ambulatory Visit (INDEPENDENT_AMBULATORY_CARE_PROVIDER_SITE_OTHER)

## 2024-06-01 DIAGNOSIS — F1020 Alcohol dependence, uncomplicated: Secondary | ICD-10-CM | POA: Diagnosis not present

## 2024-06-01 DIAGNOSIS — F418 Other specified anxiety disorders: Secondary | ICD-10-CM

## 2024-06-01 DIAGNOSIS — F4310 Post-traumatic stress disorder, unspecified: Secondary | ICD-10-CM | POA: Diagnosis not present

## 2024-06-01 NOTE — Progress Notes (Addendum)
 Daily Group Progress Note   Program: CD IOP     Group Time: 9 a.m. to 12 p.m.      Type of Therapy: Process and Psychoeducational    Topic: The therapists check in with group members, assess for SI/HI/psychosis and overall level of functioning. The therapists inquire about sobriety date and number of community support meetings attended since last session.    Therapists discussed the Matrix Model's 12 step Step Introduction on How to find a Meeting.  Therapists discussed the various types of 12 step meetings that exist within the community, Therapist inquired of group members why one would pick up a start over chip. Therapists elaborate that it is not to just to get people to own up to their lapse or relapse, but it is also picked up so other members can know who may need extra support.  Therapists also discuss why a person is asked to introduce themselves as an alcoholic in the meetings including that when people identify as an alcoholic is is often out of shame as this helps them learn to associate the term with a different meaning and also show respects toward other meeting members who struggle with the disease, therapist discuss how anhedonia is part of depression but when experienced only within the course of addiction is labeled a substance induced mood disorder, discussed how boredom and feeling lack of motivation are part of anhedonia, therapists discuss the importance of scheduling with dealing of anhedonic issues.   Summary:  Lynn Cummings rates her depression and her anxiety as a 0.  She states she has not been to any meetings not does she have a sponsor. She reports the same sobriety date.  Lynn Cummings says she has been doing pretty good. She is back to getting REM sleep.  She says prior to this, she felt lazy. Lynn Cummings says over the weekend she got a burst of energy and started doing things. Lynn Cummings identifies her emotions as good and optimistic.   She asks if people wait until they finish IOP before  they start attending meetings. Group members and therapists respond to her question letting her know that anyone can start as soon as they have a desire to stop drinking.    Progress Towards Goals: Lynn Cummings reports her sobriety date as 05-08-24.  UDS collected: Yes Results: none  AA/NA attended?:  No   Sponsor?:  No   Lynn Simpler, MS, LMFT, LCAS [address], [place], [place], [facility], LCAS  05/29/2024

## 2024-06-03 ENCOUNTER — Encounter (HOSPITAL_COMMUNITY): Payer: Self-pay | Admitting: Medical

## 2024-06-03 ENCOUNTER — Other Ambulatory Visit (INDEPENDENT_AMBULATORY_CARE_PROVIDER_SITE_OTHER)

## 2024-06-03 ENCOUNTER — Ambulatory Visit (INDEPENDENT_AMBULATORY_CARE_PROVIDER_SITE_OTHER): Admitting: Medical

## 2024-06-03 DIAGNOSIS — G250 Essential tremor: Secondary | ICD-10-CM

## 2024-06-03 DIAGNOSIS — M47812 Spondylosis without myelopathy or radiculopathy, cervical region: Secondary | ICD-10-CM

## 2024-06-03 DIAGNOSIS — R7989 Other specified abnormal findings of blood chemistry: Secondary | ICD-10-CM

## 2024-06-03 DIAGNOSIS — Z8719 Personal history of other diseases of the digestive system: Secondary | ICD-10-CM

## 2024-06-03 DIAGNOSIS — K219 Gastro-esophageal reflux disease without esophagitis: Secondary | ICD-10-CM

## 2024-06-03 DIAGNOSIS — Z9889 Other specified postprocedural states: Secondary | ICD-10-CM

## 2024-06-03 DIAGNOSIS — E786 Lipoprotein deficiency: Secondary | ICD-10-CM

## 2024-06-03 DIAGNOSIS — F1020 Alcohol dependence, uncomplicated: Secondary | ICD-10-CM

## 2024-06-03 DIAGNOSIS — J302 Other seasonal allergic rhinitis: Secondary | ICD-10-CM

## 2024-06-03 DIAGNOSIS — Z79899 Other long term (current) drug therapy: Secondary | ICD-10-CM

## 2024-06-03 NOTE — Progress Notes (Signed)
 Pt tolerated labs well in right arm, with no problems.    JNL, CMA

## 2024-06-03 NOTE — Progress Notes (Signed)
 Daily Group Progress Note   Program: CD IOP     Group Time: 9 a.m. to 12 p.m.      Type of Therapy: Process and Psychoeducational    Topic: The therapists check in with group members, assess for SI/HI/psychosis and overall level of functioning. The therapists inquire about sobriety date and number of community support meetings attended since last session.   The therapists talk about non-addictive sleep medications to address sleep related problems due to PAWS. The therapists discuss how adjusting to stable routine at first will seem strange or constrictive at first but will feel better the longer one is sober.  The therapists cover Matrix Model module ERS 4A covering what Sponsors do and how to obtain Sponsors. The therapists review material on the pitfalls associated with cannabis use and the reasons that [place]  sober does not work showing a section of a video from Dr. Franky High concerning this topic.    Summary: [name] presents today rating her depression and anxiety both as a 0.  She describes her mood as content and optimistic.  She is quiet and attentive throughout the entire group and is the least talkative member in group today.  When asked about whether or not she has attended a meeting, he says that there is an AA meeting nearly where she lives which she could possibly attend.  Therapist informs her that a couple of women from group leave directly from IOP and go straight to an AA meeting suggesting that she might possibly be able to accompany them.  She indicates that she would be open to trying this.   Progress Towards Goals: [name] reports no change in her sobriety date.    UDS collected: No Results: No  AA/NA attended?:  No   Sponsor?:  No   Elsie Maier, MA, [place], [facility], LCAS Darice Simpler, [place], LCAS 06/03/2024

## 2024-06-03 NOTE — Progress Notes (Signed)
   [facility] Follow-up Outpatient CDIOP Date: 06/03/2024  Admission Date:05/27/2024  Sobriety date:05/07/2024  Subjective: I'm doing well  HPI : CD IOP Provider FU This is Lynn Cummings' 1st FU in CDIOP. Opted to try Baclofen MAT and reports some tiredness with medication that does not interfere with her ability to function and adds that she finds it is helping her sleep.with dreams she did not have drinking alcohol. She reports no additional alcohol use/change in sobriety date. She was unaware of her abnormal TSH finding during her hospital stay and wants us  to recheck it here today.  Counselor's report: Summary: Lynn Cummings presents today rating her depression and anxiety both as a 0.  She describes her mood as content and optimistic.   She is quiet and attentive throughout the entire group and is the least talkative member in group today.  When asked about whether or not she has attended a meeting, she says that there is an AA meeting nearly where she lives which she could possibly attend.   Therapist informs her that a couple of women from group leave directly from IOP and go straight to an AA meeting suggesting that she might possibly be able to accompany them.  She indicates that she would be open to trying this.   Review of Systems: Psychiatric: Agitation: See Counselor report Hallucination: No Depressed Mood: see Counselor reports Insomnia: Sleep has improved with cessation of alcohol use See HPI Hypersomnia: No Altered Concentration: No Feels Worthless: No Grandiose Ideas: No Belief In Special Powers: No New/Increased Substance Abuse: No Compulsions: In early withdrawal  Neurologic: Headache: No Seizure: No Paresthesias: No  Current Medications: Your Medication List  baclofen 10 MG tablet Commonly known as: LIORESAL Take 1 tablet (10 mg total) by mouth 3 (three) times daily.  pantoprazole  40 MG tablet Commonly known as: PROTONIX  Take 40 mg by mouth daily.   potassium chloride  SA 20 MEQ tablet Commonly known as: KLOR-CON  M Take 1 tablet (20 mEq total) by mouth 2 (two) times daily.    Mental Status Examination  Appearance: Casual ,Well groomed Alert: Yes Attention: good  Cooperative: Yes Eye Contact: Good Speech: Clear and coherent, rate WNL Psychomotor Activity: Normal Memory: Intact Concentration/Attention: Normal/intact Oriented: person, place, time/date and situation Mood: Euthymic Affect: Appropriate and Congruent Thought Processes and Associations: Coherent and Intact Fund of Knowledge: Good Thought Content: WDL No SI/HI Insight:Limited Judgement:Impaired  LID:Ezwipwh  PDMP:No entries since 2023  Diagnosis:  Alcohol use disorder, severe, dependence (HCC) Elevated LFTs S/P dilatation of esophageal stricture Gastroesophageal reflux disease without esophagitis High serum high density lipoprotein (HDL) Elevated ratio of cholesterol to high density lipoprotein (HDL) Elevated TSH Seasonal allergies Benign essential tremor Cervical spondylosis without myelopath  Assessment: Doing well after 1st week of treatment (she has never had any treatment for SUD)  Treatment Plan:Per Admission Plan and Counselors  Lynn Emmer, PA-CPatient ID: Lynn Cummings, female   DOB: 01-Apr-1956, 68 y.o.   MRN: [id number]

## 2024-06-04 LAB — THYROID PANEL WITH TSH
Free Thyroxine Index: 1.6 (ref 1.2–4.9)
T3 Uptake Ratio: 27 % (ref 24–39)
T4, Total: 5.8 ug/dL (ref 4.5–12.0)
TSH: 2.99 u[IU]/mL (ref 0.450–4.500)

## 2024-06-05 ENCOUNTER — Ambulatory Visit (INDEPENDENT_AMBULATORY_CARE_PROVIDER_SITE_OTHER): Admitting: Licensed Clinical Social Worker

## 2024-06-05 DIAGNOSIS — F1020 Alcohol dependence, uncomplicated: Secondary | ICD-10-CM

## 2024-06-05 DIAGNOSIS — F418 Other specified anxiety disorders: Secondary | ICD-10-CM

## 2024-06-05 NOTE — Progress Notes (Signed)
 Daily Group Progress Note   Program: CD IOP     Group Time: 9 a.m. to 12 p.m.      Type of Therapy: Process and Psychoeducational    Topic: The therapists check in with group members, assess for SI/HI/psychosis and overall level of functioning. The therapists inquire about sobriety date and number of community support meetings attended since last session.   The therapists discuss dealing with emotions explaining that emotions are neither good nor bad or right or wrong but like indicators on a car dashboard helping to guide people through life effectively. The therapists explain the importance of having social supports pointing out that whether or not a person is in recovery that how much social capital a person has is the key factor in almost all people's level of life satisfaction. The therapists continue to explain that using marijuana or switching addictions leads back to using the person's primary substance given that any use of addictive or controlled substances prevents a person's brain from building back depleted dopamine receptors. The therapists continue explain that addiction is a disease versus a character defect and address the issue of whether or not a person wants to come out as an alcoholic or person in recovery to other people in their lives. The therapists discourage quitting smoking or vaping cold [place] explaining how doing so can possibly lead to negatively reinforcing the behavior the person wants to stop. The therapists make group members aware of [place] Quits.      Summary: Lynn Cummings presents today rating both her depression and anxiety as a 0.  She says that her sobriety date remains May 30th.  She tells her story of how she came to be in group saying that her drinking progressed to a point where she did not know when to stop and began having frequent blackouts and falling episodes.  She talks about having fallen and having a piece of glass cut her tear duct.  Lynn Cummings says that the  medication that she is being prescribed by the PA-C is the best stuff in the world and that she is able to drive past liquor stores without thinking of drinking and she reports that she is sleeping extremely well and getting REM sleep for the first time in a long time.  She has not attended any meetings and continues to allude to meetings near where she lives without specifically saying when she is going to follow through and attend a meeting.  She says that she is keeping herself busy with projects around the house that she did not do when she was drinking.  The therapists observed that Lynn Cummings has no other supports than her husband and is likely to be even more isolated given her recent retirement.  The therapists informed Lynn Cummings that she will eventually run out of things to do around the house and strongly encouraged her to start getting to some meetings so as to have some supports in her life.  During the discussion of marijuana, Lynn Cummings shares about having smoked marijuana about 10 years ago when visiting friends who were regular smokers.  She says that she had a horrible experience and that she was on these friends' sofa and was physically unable to move.  She has not used any marijuana since that time.    Progress Towards Goals: Lynn Cummings reports no change in her sobriety date   UDS collected: No Results: Yes, negative for drugs and alcohol    AA/NA attended?:  No   Sponsor?:  No  Elsie Maier, MA, LCSW, [facility], LCAS Darice Simpler, [place], LCAS 06/05/2024

## 2024-06-08 ENCOUNTER — Ambulatory Visit (INDEPENDENT_AMBULATORY_CARE_PROVIDER_SITE_OTHER)

## 2024-06-08 DIAGNOSIS — F1020 Alcohol dependence, uncomplicated: Secondary | ICD-10-CM | POA: Diagnosis not present

## 2024-06-08 NOTE — Progress Notes (Signed)
 Daily Group Progress Note   Program: CD IOP     Group Time: 9 a.m. to 12 p.m.      Type of Therapy: Process and Psychoeducational    Topic: The therapists check in with group members, assess for SI/HI/psychosis and overall level of functioning. The therapists inquire about sobriety date and number of community support meetings attended since last session.   Therapists present The Stages of Recovery by Marcey HERO. [place], specifically focusing on the abstinence stage. Therapist elaborate and focus on the abstinence stage, explaining that it starts immediately after a person stops using and usually lasts for 1-2 years. Therapist explained the main focus of this stage is dealing with cravings and not using. Therapist explain the tasks associated with the abstinence stage while prompting conversation regarding how group members are working on the tasks which include the following: Accept the one has an addiction, practice honesty in life, develop coping skills for dealing with cravings, become active in self-help groups, practice self-care and saying no, understand the stages of relapse, change people you are associating with if they are in active addiction.    Summary:  Lynn Cummings rates her depression and her anxiety as a 0.  She states she has not been to any meetings not does she have a sponsor. She reports the same sobriety date.  Lynn Cummings says she has 30 days of sobriety as of today. A peer assisted her in finding the app everything AA so Lynn Cummings could find meetings. Lynn Cummings identifies her emotions as happy and hopeful. During the discussion about finding sober activities, Lynn Cummings says her HOA has summer activities and has a ladies night out each month. Therapists asks if there is any alcohol  present and she says that some people drink and some do not. She says she has not been involved in any of the HOA activities since going through detox and coming to IOP. She reports she is on baclofen  which she says is  very helpful and she has no cravings or temptations to drink.  During the discussion of self-care with exploring individual's purpose for life, Lynn Cummings shares she does have something on her bucket list and that is jumping out of a plane. Lynn Cummings says her take away for the day was learning about self-care noting when she was in active addiction, she was neglectful of her self-care.    Progress Towards Goals: Lynn Cummings reports her sobriety date as 05-08-24.  UDS collected: Yes Results: none  AA/NA attended?:  No   Sponsor?:  No   Lynn Simpler, MS, LMFT, LCAS [address] [place], [place], [facility], LCAS  06/08/2024

## 2024-06-10 ENCOUNTER — Ambulatory Visit (INDEPENDENT_AMBULATORY_CARE_PROVIDER_SITE_OTHER)

## 2024-06-10 DIAGNOSIS — F1020 Alcohol dependence, uncomplicated: Secondary | ICD-10-CM | POA: Diagnosis not present

## 2024-06-10 NOTE — Progress Notes (Signed)
 Daily Group Progress Note   Program: CD IOP     Group Time: 9 a.m. to 12 p.m.      Type of Therapy: Process and Psychoeducational    Topic: The therapists check in with group members, assess for SI/HI/psychosis and overall level of functioning. The therapists inquire about sobriety date and number of community support meetings attended since last session.   Therapists present Module 2 from [facility] which involves three worksheets and prompts discussion on how group members evaluate their substance use The first is the [employer] which has two columns, one about good things about changing as it applies to use of substances and the second column being not so good things about changing the behavior as it applies to substance use.  The second worksheet is Personal Rulers Worksheets which has 3 questions: 1) How important is it that you make a change in your substance using. 2) If you decided that you wanted to quit using, how confident are yo that you could actually do it. And 3) How ready are you to make a change in your substance use. The third worksheet Benefits of Change entails 4 topics for clients to indicate what benefits they saw in recovery the 1) within a week, 2) within a month, #3) within a year and 4) In the long run.    Summary:  Lynn Cummings rates her depression and her anxiety as a 0. She says she has the same sobriety date.  Lynn Cummings has not attended any meetings, nor does she have a sponsor. She identifies her emotions as happy and hopeful.   Lynn Cummings says the good things about changing from addictive addiction to recovery is good things happen . She says she feels better and is not hurting herself by falling.  Lynn Cummings says the not-so good things about changing from addictive addiction to recovery is facing the scary things. Lynn Cummings rates her questions to the [employer] as all 10. In the benefit of change worksheet, Lynn Cummings  says she within a week she was feeling better, walking better and sleeping better. She says within a month she was feeling better physically, as she was no longer falling. She says she is planning to join a gym. Lynn Cummings says she does not know what will happen or what to expect in a year or long term.     Progress Towards Goals: Lynn Cummings reports her sobriety date as 05-08-24.  UDS collected: No  Results: none  AA/NA attended?:  No   Sponsor?:  No   Lynn Simpler, MS, LMFT, LCAS [address], [place], [place], [facility], LCAS  06/10/2024

## 2024-06-15 ENCOUNTER — Ambulatory Visit (INDEPENDENT_AMBULATORY_CARE_PROVIDER_SITE_OTHER): Admitting: Licensed Clinical Social Worker

## 2024-06-15 DIAGNOSIS — F1020 Alcohol dependence, uncomplicated: Secondary | ICD-10-CM | POA: Diagnosis not present

## 2024-06-15 DIAGNOSIS — F418 Other specified anxiety disorders: Secondary | ICD-10-CM | POA: Diagnosis not present

## 2024-06-15 NOTE — Progress Notes (Signed)
 Daily Group Progress Note   Program: CD IOP     Group Time: 9 a.m. to 12 p.m.      Type of Therapy: Process and Psychoeducational    Topic: The therapists check in with group members, assess for SI/HI/psychosis and overall level of functioning. The therapists inquire about sobriety date and number of community support meetings attended since last session.   The therapists the various factors that can cause people to lose momentum in their recovery including overconfidence, stress and challenges, physical the psychological factors, relapse triggers, lack of coping skills, insufficient support, unrealistic expectations, and poor self-care. The therapists inform group members that if they do not attend 12 step groups that they will have to consider what their alternative sober support system is going to be. The therapists that one must be willing to get out of his or her comfort zone and think outside the box. The therapists facilitate group discussion on these factors eliciting feedback from group members.     Summary: Lynn Cummings presents today rating her depression and anxiety both as a zero. She reports having the same sobriety date and describes her mood as being "happy" and "hopeful."  She says that she has yet to attend any meetings but says that she does now have both the meeting apps successfully downloaded on her cell phone. The therapist points out that this is progress such that she now will be able to easily find a meeting when she is ready to try one online.  During the group discussion portion today, Lynn Cummings is for the most part quiet but attentive.   Progress Towards Goals: Lynn Cummings reports no change in her sobriety date   UDS collected: No Results: No   AA/NA attended?:  No   Sponsor?:  No     Lynn Maier, MA, [place], [facility], LCAS Darice Simpler, LMFT, LCAS 06/15/2024

## 2024-06-17 ENCOUNTER — Ambulatory Visit (INDEPENDENT_AMBULATORY_CARE_PROVIDER_SITE_OTHER): Admitting: Licensed Clinical Social Worker

## 2024-06-17 DIAGNOSIS — F418 Other specified anxiety disorders: Secondary | ICD-10-CM | POA: Diagnosis not present

## 2024-06-17 DIAGNOSIS — F1020 Alcohol dependence, uncomplicated: Secondary | ICD-10-CM

## 2024-06-17 NOTE — Progress Notes (Addendum)
 Daily Group Progress Note   Program: CD IOP     Group Time: 9 a.m. to 12 p.m.      Type of Therapy: Process and Psychoeducational    Topic: The therapists check in with group members, assess for SI/HI/psychosis and overall level of functioning. The therapists inquire about sobriety date and number of community support meetings attended since last session.   The therapists introduce a new group member.  The therapists focused primarily on the early stage of recovery and postacute withdrawal syndrome and how to cope with it.  Additionally, the therapists share information regarding how to improve sleep hygiene.  Additionally, the therapists discussed the need for people in recovery to assure that they do not have drugs or alcohol  stashed in their house that they have forgotten and the need for them to have people who are not actively in addiction help them to search for and dispose of any remaining drugs and alcohol  for them.     Summary: Lynn Cummings presents today rating her depression and anxiety as a 0.  She reports no change in her sobriety date.  She describes her mood as being happy and hopeful.  She is still not attended any 12-step meetings but says that she has been looking over the apps on her phone.  The therapist explores her reasons for not having attended a meeting with Lynn Cummings saying that she would likely be nervous going to a meeting all by herself.  The therapist observes that 2 of the women in group have offered to accompany her to a meeting.  Other group members also remind her that she could start by attending a virtual meeting with her camera off and her microphone off as well.  The therapist relate her lack of motivation as possibly being related to PAWS.   Progress Towards Goals: Lynn Cummings reports no change in her sobriety date   UDS collected: Yes  Results: Yes, negative for drugs and alcohol    AA/NA attended?:  No   Sponsor?:  No   Elsie Maier, MA, [place], [facility], LCAS Darice Simpler, LMFT, LCAS 06/17/2024

## 2024-06-19 ENCOUNTER — Ambulatory Visit (INDEPENDENT_AMBULATORY_CARE_PROVIDER_SITE_OTHER): Admitting: Licensed Clinical Social Worker

## 2024-06-19 DIAGNOSIS — F1020 Alcohol dependence, uncomplicated: Secondary | ICD-10-CM | POA: Diagnosis not present

## 2024-06-19 DIAGNOSIS — F418 Other specified anxiety disorders: Secondary | ICD-10-CM | POA: Diagnosis not present

## 2024-06-19 NOTE — Progress Notes (Signed)
 Daily Group Progress Note   Program: CD IOP     Group Time: 9 a.m. to 12 p.m.      Type of Therapy: Process and Psychoeducational    Topic: The therapists check in with group members, assess for SI/HI/psychosis and overall level of functioning. The therapists inquire about sobriety date and number of community support meetings attended since last session.   The therapists provide feedback on how to find a sponsor.  They validates that interacting with the medical system on a regular basis can be frustrating and model how to assertively deal with problems they encounter.  They discussed the concept of learned helplessness and how to move beyond it.  They focused primarily on one of the 5 rules of recovery which is change your life.  They validate that in order to recover that 1 must get out of his comfort zone and must be willing to listen to the feedback given by those with long-term sobriety and the professionals with whom they work.  The therapist explained that the fact that AA is 69 years old with meetings available worldwide seems to suggest that it is offering something that people find actually works.  The therapists recommend that people in recovery not overthink taking action and say I will rather than I will try.     Summary: Lynn Cummings presents today rating both her depression and anxiety as a 0 and reporting no change in her sobriety date.  She continues to describe her mood as happy and hopeful.  She says that she has opened the recovery apps on her phone and started reading the daily reflections as well as the 12 steps and 12 traditions.  She is unable to verbalize what she has gotten out of these readings saying that it is not deep.  The therapist suggests that Lynn Cummings can keep a notebook and write down brief notes on things that she learns from these readings and from attending IOP.  She is, as is customary for her, mostly silent and attentive throughout group and her affect is  bright.   Progress Towards Goals: Lynn Cummings reports no change in her sobriety date   UDS collected: No Results: No   AA/NA attended?:  No   Sponsor?:  No     Lynn Maier, MA, [place], [facility], LCAS Lynn Simpler, LMFT, LCAS 06/19/2024

## 2024-06-22 ENCOUNTER — Ambulatory Visit (INDEPENDENT_AMBULATORY_CARE_PROVIDER_SITE_OTHER)

## 2024-06-22 DIAGNOSIS — F1020 Alcohol dependence, uncomplicated: Secondary | ICD-10-CM | POA: Diagnosis not present

## 2024-06-22 DIAGNOSIS — K219 Gastro-esophageal reflux disease without esophagitis: Secondary | ICD-10-CM

## 2024-06-22 NOTE — Progress Notes (Signed)
 Daily Group Progress Note   Program: CD IOP     Group Time: 9 a.m. to 12 p.m.      Type of Therapy: Process and Psychoeducational    Topic: The therapists check in with group members, assess for SI/HI/psychosis and overall level of functioning. The therapists inquire about sobriety date and number of community support meetings attended since last session.   Therapist introduces the newest member of the group. Therapist continues to process issues around the  5 rule of recovery by Elspeth Rocker with rule 2 being Change your life. Facilitation was held regarding if group members felt recovery is not easy, but it is simple. Discussed issues such as urges that one cannot be strong enough to make disappear, rather focussed on how creating a new life  Noting if one does not create a new life, then all the factors that brought one to addiction will eventually catch up with you again   Therapist discusses grief and addiction and how the two affect one another.  Prompted discusses in group which resonated with members   Summary: Lynn Cummings does not complete a daily self inventory sheet. She says her sobriety date is May 30.  She identifies her emotions as happy and hopeful. She has not attended meetings, nor does she have a sponsor.  She shares her story of how she came to CDIOP with the new group member.  She shares that her husband called someone at [facility].  She says she continued to drink and sneak around drinking and blacked out more. She was falling and hurting herself.  Therapist asks Lynn Cummings what she feels she needs to work on in CDIOP.  She says an aunt who is 73 that she has helped her out in the past and she is going to try to call her this week. Therapist asks what else would be helpful for her to work on in CDIOP.  She responds by saying taking one step and a time.   Progress Towards Goals: Lynn Cummings reports no change in her sobriety date   UDS collected:  Yes  Results: No   AA/NA attended?:   No   Sponsor?:  No     Lynn Simpler, LMFT, LCAS 06/22/2024

## 2024-06-24 ENCOUNTER — Ambulatory Visit (INDEPENDENT_AMBULATORY_CARE_PROVIDER_SITE_OTHER)

## 2024-06-24 DIAGNOSIS — F1020 Alcohol dependence, uncomplicated: Secondary | ICD-10-CM

## 2024-06-24 NOTE — Progress Notes (Addendum)
 Daily Group Progress Note   Program: CD IOP     Group Time: 9 a.m. to 12 p.m.      Type of Therapy: Process and Psychoeducational    Topic: The therapists check in with group members, assess for SI/HI/psychosis and overall level of functioning. The therapists inquire about sobriety date and number of community support meetings attended since last session.   Introduced new group member. Therapist presents and prompts discussion on Elspeth Rocker, MD, PHD Five Rules of Recovery, rule #2 which is be completely honest. Therapist discussing specifics of this rule including recovery requiring complete honesty, showing common sense about disclosing one's addiction and what it means to be 100% completely honest. Therapist discusses the importance and meaning of picking up a white chip in meetings.     Summary: Lynn Cummings rated her anxiety and depression as a 0.  She says her sobriety date is May 30.  She identifies her emotions as calm, happy and hopeful. She has not attended meetings, nor does she have a sponsor.  She shares her story of how she came to CDIOP with the new group member.  She says she expereinced increasing blackouts and multiple falls to the point she was injuring herself.  She shares that her husband was worried and ended up called someone at [facility].  She says she went to detox downstairs and stayed 5 days.     Progress Towards Goals: Lynn Cummings reports no change in her sobriety date   UDS collected:  No  Results: No   AA/NA attended?:  No   Sponsor?:  No     Darice Simpler, LMFT, LCAS 06/24/2024

## 2024-06-26 ENCOUNTER — Ambulatory Visit (INDEPENDENT_AMBULATORY_CARE_PROVIDER_SITE_OTHER)

## 2024-06-26 DIAGNOSIS — F1020 Alcohol dependence, uncomplicated: Secondary | ICD-10-CM

## 2024-06-26 NOTE — Progress Notes (Addendum)
 Daily Group Progress Note   Program: CD IOP     Group Time: 9 a.m. to 12 p.m.      Type of Therapy: Process and Psychoeducational    Topic: The therapists check in with group members, assess for SI/HI/psychosis and overall level of functioning. The therapists inquire about sobriety date and number of community support meetings attended since last session.  Introduce new group member. Ask group members to share something about their story. The therapist presents rule # 3 of Elspeth Rocker, MD, PHD,  which is Ask for Help in the The Five Rules of Recovery. Therapist discusses how shame and isolation perpetuate addiction and how recovery involves reaching out and asking for help from people in recovery. Therapist discusses the following topics about 12 step meetings: 1) How they help people decide if they have an addiction, 2) how one meets other people who are going through the same thing, 3) how seeing the recovery is possible, 4) learning about other people's recovery techniques, 5) not being judged, 6)being reminded of the consequences of using, 6) having a safe place to go.  Therapist facilitates discussion on these topics.    Summary: Lynn Cummings rated her anxiety and depression as a 0.  She says her sobriety date is May 30.  She identifies her emotions as content and hopeful. She is not attending meetings, nor does she have a sponsor. Therapist asks what is getting in her way of trying a virtual meeting and Lynn Cummings says she does not think she needs them. She says she is happy.  Therapist discusses pink cloud/PAWS and Lynn Cummings says she has never heard that, though it has been discussed in group. Therapist asked peers to give their point of view on how meetings have helped them and they did so, pointing out how hearing other's stories that resonate with their own helps alleviate the feeling that they are suffering alone.  Oen peer offers to bring his Big Book to let her read. She says she has it on her  app but does not read it. Lynn Cummings shares with the new group member a portion of her story saying she started blacking out from drinking and her husband would find that she had fallen and she ended up in detox.   Progress Towards Goals: Lynn Cummings reports no change in her sobriety date   UDS collected:  No  Results: negative   AA/NA attended?:  No   Sponsor?:  No     Darice Simpler, LMFT, LCAS 06/26/2024

## 2024-06-29 ENCOUNTER — Ambulatory Visit (INDEPENDENT_AMBULATORY_CARE_PROVIDER_SITE_OTHER): Admitting: Licensed Clinical Social Worker

## 2024-06-29 DIAGNOSIS — F1020 Alcohol dependence, uncomplicated: Secondary | ICD-10-CM | POA: Diagnosis not present

## 2024-06-29 NOTE — Progress Notes (Addendum)
 Daily Group Progress Note   Program: CD IOP     Group Time: 9 a.m. to 12 p.m.      Type of Therapy: Process and Psychoeducational    Topic: The therapists check in with group members, assess for SI/HI/psychosis and overall level of functioning. The therapists inquire about sobriety date and number of community support meetings attended since last session.   The therapists facilitate discussions on the following topics; the differences between helping versus enabling, how to reach out to others in the program to start making connections and what to say during the initial telephone contact, and the fact that active substance use impedes one's ability to successfully recover from trauma and grieve losses.  The therapists explain that in early recovery that people will have to force themselves to do things they do not want to do; however, over time they will eventually look forward to doing the things that they once had to force themselves to do.     Summary: Lynn Cummings presents today rating both her depression and anxiety is a 0.  She reports no changes in her sobriety date and says that her mood is content and hopeful.  She says that she still has not attended any meetings and says that she could probably try going to a couple.  Therapist, who was not in group last week, observes that Lynn Cummings reportedly told the other therapist last week that she did not believe that she needed to attend meetings.  The therapist inquires as to the reason that Lynn Cummings comes to group if she is having no symptoms and does not feel in need of sober support.  Lynn Cummings responds by saying that she likes hearing the other people's stories and admits that she really does not have much else to do.  Another group member questions if Lynn Cummings might be coming to group simply to keep her husband off her back; however, Lynn Cummings says that her husband does not really pressure her but he is happy that she is attending.  The therapist asks  Lynn Cummings to consider how she might do once she is no longer coming to IOP and what her plans are to have social connections as she is newly retired.  Therapist questions what Lynn Cummings plans to do with the rest of her life now that she is retired with Lynn Cummings admitting that she does not know.  The therapist also makes the observation that Lynn Cummings has been visiting herself with projects around the house that she neglected while drinking but points out the fact that she will eventually run out of projects.  Lynn Cummings is given the homework assignment of answering these questions concerning what she hopes to get out of group, what she will do for social connection now that she has retired, and what the meaning of her life is going to be after her work career has come to an end.    Progress Towards Goals: Lynn Cummings reports no change in her sobriety date   UDS collected: Yes Results: Yes, two negative UDS   AA/NA attended?:  No   Sponsor?:  No     Elsie Maier, MA, [place], [facility], LCAS Darice Simpler, LMFT, LCAS 06/29/2024

## 2024-07-01 ENCOUNTER — Ambulatory Visit (INDEPENDENT_AMBULATORY_CARE_PROVIDER_SITE_OTHER): Admitting: Licensed Clinical Social Worker

## 2024-07-01 DIAGNOSIS — Z9889 Other specified postprocedural states: Secondary | ICD-10-CM

## 2024-07-01 DIAGNOSIS — E786 Lipoprotein deficiency: Secondary | ICD-10-CM

## 2024-07-01 DIAGNOSIS — J302 Other seasonal allergic rhinitis: Secondary | ICD-10-CM

## 2024-07-01 DIAGNOSIS — K219 Gastro-esophageal reflux disease without esophagitis: Secondary | ICD-10-CM

## 2024-07-01 DIAGNOSIS — F1020 Alcohol dependence, uncomplicated: Secondary | ICD-10-CM

## 2024-07-01 DIAGNOSIS — M47812 Spondylosis without myelopathy or radiculopathy, cervical region: Secondary | ICD-10-CM

## 2024-07-01 DIAGNOSIS — R7989 Other specified abnormal findings of blood chemistry: Secondary | ICD-10-CM

## 2024-07-01 DIAGNOSIS — G250 Essential tremor: Secondary | ICD-10-CM

## 2024-07-01 DIAGNOSIS — Z8719 Personal history of other diseases of the digestive system: Secondary | ICD-10-CM

## 2024-07-01 NOTE — Progress Notes (Signed)
 Daily Group Progress Note   Program: CD IOP     Group Time: 9 a.m. to 12 p.m.      Type of Therapy: Process and Psychoeducational    Topic: The therapists check in with group members, assess for SI/HI/psychosis and overall level of functioning. The therapists inquire about sobriety date and number of community support meetings attended since last session.   The therapists introduce a new group member. The therapists discuss the reasons that persons with family histories of alcoholism or who have an alcohol  use disorder should not be on a benzodiazepine. The therapists talk to group members about [employer] testing answering their questions about this. The therapists dispel misinformation about baclofen  causing withdrawal based on misinformation passed on by another member at a previous group. The therapists discuss what using rituals are and how to avoid them. The therapists share information related to Agnostics and Atheists who attend AA and NA in spite of talk of God and discuss alternatives such as Smart Recovery. The therapists talk about the different types of meetings such as speaker meetings, [employer], etcetera and facilitate group discussion on all of the topics soliciting feedback from group members.      Summary: Lynn Cummings presents today rating her depression and anxiety customarily both as being a 0.  Lynn Cummings is silent throughout group and never volunteers to share so was the last person to do so today.  She has not attended any meetings and describes her mood as content and hopeful.  She says that she did not do her homework.  She says that she has been reading stuff from one of the recovery apps; however, when asked what she has gotten out of these readings, she is unable to answer at one point mumbling about the print being too small to read some of it.  Lynn Cummings remains silent throughout group appearing attentive but not participating in any of the group discussion which  is the norm for her.  Given that Lynn Cummings is not drinking, reports having no mood problems whatsoever, and has indicated that she does not feel the need to attend meetings or expand her social support, the therapist questions if it is believes that she is ready to end SA IOP and she answers affirmatively.  She declines the need for any outpatient therapy saying that the only thing she wishes to do is to continue on baclofen .  The therapist informs her that he will need to discuss this with the [facility] to see if he will prescribe this or will want her PCP to start doing so.  Lynn Cummings says that she was not planning on being in group Friday due to another medical appointment so the therapist will reach out to her by phone after staffing this with the [facility].   Progress Towards Goals: Lynn Cummings reports no change in her sobriety date   UDS collected: No Results: No   AA/NA attended?:  No  Sponsor?:  No   Lynn Maier, MA, [place], [facility], LCAS Lynn Cummings, [place], LCAS 07/01/2024

## 2024-07-03 ENCOUNTER — Other Ambulatory Visit (HOSPITAL_COMMUNITY): Payer: Self-pay | Admitting: Medical

## 2024-07-03 ENCOUNTER — Ambulatory Visit (HOSPITAL_COMMUNITY)

## 2024-07-03 ENCOUNTER — Telehealth (HOSPITAL_COMMUNITY): Payer: Self-pay

## 2024-07-03 NOTE — Progress Notes (Signed)
 Patient ID: Lynn Cummings, female   DOB: [birth year], 68 y.o.   MRN: [id number]                                                                                [facility] CD IOP                                                                                Discharge Summary   Date of Admission: 05/25/2024 Referall Source: [facility]                                                                        Date of Discharge: 07/01/2024 Sobriety Date:05/07/2024 Admission Diagnosis: 1. Alcohol  use disorder, severe, dependence (HCC)  F10.20       2. Elevated LFTs  R79.89      Alcohol  related     3. S/P dilatation of esophageal stricture  Z98.890      Z87.19       4. High serum high density lipoprotein (HDL)  R79.89       5. Elevated ratio of cholesterol to high density lipoprotein (HDL)  E78.6       6. Elevated TSH  R79.89    Course of Treatment:   Opted to try Baclofen  MAT and reports some tiredness with medication that does not interfere with her ability to function and adds that she finds it is helping her sleep.with dreams she did not have drinking alcohol . She reports no additional alcohol  use/change in sobriety date. She was unaware of her abnormal TSH finding during her Cummings stay and recheck here was normal Group Progress Note 07/01/2024 Summary: Lynn Cummings presents today rating her depression and anxiety customarily both as being a 0.  Lynn Cummings is silent throughout group and never volunteers to share so was the last person to do so today.   She has not attended any meetings and describes her mood as content and hopeful.  She says that she did not do her homework.  She says that she has been reading stuff from one of the recovery apps; however, when asked what she has gotten out of these readings, she is unable to answer at one point mumbling about the print being too small to read some of it.  Lynn Cummings remains silent throughout group appearing attentive but not participating in any of the group  discussion which is the norm for her.   Given that Lynn Cummings is not drinking, reports having no mood problems whatsoever, and has indicated that she does not feel the need to attend meetings or expand her social support, the therapist questions if it is  believes that she is ready to end SA IOP and she answers affirmatively.   She declines the need for any outpatient therapy saying that the only thing she wishes to do is to continue on baclofen .  The therapist informs her that he will need to discuss this with the [facility] to see if he will prescribe this or will want her PCP to start doing so.  Lynn Cummings says that she was not planning on being in group Friday due to another medical appointment so the therapist will reach out to her by phone after staffing this with the [facility].    Medications: baclofen  10 MG tablet Commonly known as: LIORESAL  Take 1 tablet (10 mg total) by mouth 3 (three) times daily.  pantoprazole  40 MG tablet Commonly known as: PROTONIX  Take 40 mg by mouth daily.  potassium chloride  SA 20 MEQ tablet Commonly known as: KLOR-CON  M Take 1 tablet (20 mEq total) by mouth 2 (two) times daily.   Discharge Diagnosis:                                                                                Alcohol  use disorder, severe, dependence (HCC) Elevated LFTs S/P dilatation of esophageal stricture Gastroesophageal reflux disease without esophagitis High serum high density lipoprotein (HDL) Elevated ratio of cholesterol to high density lipoprotein (HDL) Seasonal allergies Benign essential tremor Cervical spondylosis without myelop  Plan of Action to Address Continuing Problems:  Goals and Activities to Help Maintain Sobriety: Stay away from people ,places and things that are triggers Continue practicing Fair Fighting rules in interpersonal conflicts. Continue alcohol  and drug refusal skills and call on support system  Attend AA meetings AT LEAST as often as you use  Obtain a sponsor and a home  group in AA. Return to other providers as scheduled  Referrals:  Aftercare:Refused Medication management:  Called Pt after speaking with Kail Fraley, PA-C to inquire if he is willing to continue to prescribe her Baclofen .  Carlin indicated her would and have her call his office to get scheduled on a Thursday afternoon.   Therapist calls Lynn Cummings and she answers. Therapist confirms her identify by obtaining two identifiers.  Therapist explains to Lynn Cummings what Carlin Emmer, [facility] had said.  Therapist provided the phone number and address where Carlin is located.  Darice Simpler, MS, LMFT, LCAS     PDMP-Clear  Next appointment: To be scheduled  Prognosis:Guarded  Client has NOT participated in the development of this discharge plan and may receive a copy of this completed plan

## 2024-07-03 NOTE — Telephone Encounter (Signed)
 Called Pt after speaking with Charles Kober, PA-C to inquire if he is willing to continue to prescribe her Baclofen .  Carlin indicated her would and have her call his office to get scheduled on a Thursday afternoon.  Therapist calls [name] and she answers. Therapist confirms her identify by obtaining two identifiers.  Therapist explains to [facility] what Carlin Emmer, [facility] had said.  Therapist provided the phone number and address where Carlin is located.  Darice Simpler, MS, LMFT, LCAS

## 2024-07-08 ENCOUNTER — Encounter (HOSPITAL_COMMUNITY): Payer: Self-pay | Admitting: Licensed Clinical Social Worker

## 2024-07-08 NOTE — Addendum Note (Signed)
 Addended by: LEILA CARLIN BRAVO on: 07/08/2024 04:56 PM   Modules accepted: Level of Service

## 2024-07-30 ENCOUNTER — Encounter: Payer: Self-pay | Admitting: Medical
